# Patient Record
Sex: Female | Born: 1955
Health system: Southern US, Community
[De-identification: ages and names within clinical notes are randomized; demographics above are authoritative.]

## PROBLEM LIST (undated history)

## (undated) DIAGNOSIS — F32A Depression, unspecified: Secondary | ICD-10-CM

## (undated) DIAGNOSIS — F3181 Bipolar II disorder: Secondary | ICD-10-CM

## (undated) DIAGNOSIS — N200 Calculus of kidney: Secondary | ICD-10-CM

## (undated) DIAGNOSIS — K259 Gastric ulcer, unspecified as acute or chronic, without hemorrhage or perforation: Secondary | ICD-10-CM

## (undated) DIAGNOSIS — F988 Other specified behavioral and emotional disorders with onset usually occurring in childhood and adolescence: Secondary | ICD-10-CM

## (undated) DIAGNOSIS — F329 Major depressive disorder, single episode, unspecified: Secondary | ICD-10-CM

## (undated) HISTORY — DX: Bipolar II disorder: F31.81

## (undated) HISTORY — DX: Gastric ulcer, unspecified as acute or chronic, without hemorrhage or perforation: K25.9

## (undated) HISTORY — DX: Other specified behavioral and emotional disorders with onset usually occurring in childhood and adolescence: F98.8

---

## 1997-09-02 ENCOUNTER — Ambulatory Visit (HOSPITAL_COMMUNITY): Admission: RE | Admit: 1997-09-02 | Discharge: 1997-09-02 | Payer: Self-pay | Admitting: Specialist

## 2008-05-23 ENCOUNTER — Emergency Department (HOSPITAL_COMMUNITY): Admission: EM | Admit: 2008-05-23 | Discharge: 2008-05-23 | Payer: Self-pay | Admitting: Emergency Medicine

## 2010-01-10 ENCOUNTER — Emergency Department (HOSPITAL_BASED_OUTPATIENT_CLINIC_OR_DEPARTMENT_OTHER): Admission: EM | Admit: 2010-01-10 | Discharge: 2010-01-11 | Payer: Self-pay | Admitting: Emergency Medicine

## 2010-01-10 ENCOUNTER — Ambulatory Visit: Payer: Self-pay | Admitting: Diagnostic Radiology

## 2015-04-22 DIAGNOSIS — N951 Menopausal and female climacteric states: Secondary | ICD-10-CM | POA: Diagnosis not present

## 2015-04-22 DIAGNOSIS — Z01419 Encounter for gynecological examination (general) (routine) without abnormal findings: Secondary | ICD-10-CM | POA: Diagnosis not present

## 2015-04-22 DIAGNOSIS — Z8709 Personal history of other diseases of the respiratory system: Secondary | ICD-10-CM | POA: Diagnosis not present

## 2015-05-01 DIAGNOSIS — H04123 Dry eye syndrome of bilateral lacrimal glands: Secondary | ICD-10-CM | POA: Diagnosis not present

## 2015-05-21 DIAGNOSIS — K219 Gastro-esophageal reflux disease without esophagitis: Secondary | ICD-10-CM | POA: Diagnosis not present

## 2015-05-21 DIAGNOSIS — K589 Irritable bowel syndrome without diarrhea: Secondary | ICD-10-CM | POA: Diagnosis not present

## 2015-05-21 DIAGNOSIS — R05 Cough: Secondary | ICD-10-CM | POA: Diagnosis not present

## 2015-06-24 DIAGNOSIS — R05 Cough: Secondary | ICD-10-CM | POA: Diagnosis not present

## 2015-06-24 DIAGNOSIS — K449 Diaphragmatic hernia without obstruction or gangrene: Secondary | ICD-10-CM | POA: Diagnosis not present

## 2015-06-24 DIAGNOSIS — K3189 Other diseases of stomach and duodenum: Secondary | ICD-10-CM | POA: Diagnosis not present

## 2015-06-24 DIAGNOSIS — K219 Gastro-esophageal reflux disease without esophagitis: Secondary | ICD-10-CM | POA: Diagnosis not present

## 2015-08-26 DIAGNOSIS — R05 Cough: Secondary | ICD-10-CM | POA: Diagnosis not present

## 2015-08-26 DIAGNOSIS — R1110 Vomiting, unspecified: Secondary | ICD-10-CM | POA: Diagnosis not present

## 2015-08-26 DIAGNOSIS — K219 Gastro-esophageal reflux disease without esophagitis: Secondary | ICD-10-CM | POA: Diagnosis not present

## 2015-09-16 DIAGNOSIS — Z136 Encounter for screening for cardiovascular disorders: Secondary | ICD-10-CM | POA: Diagnosis not present

## 2015-09-16 DIAGNOSIS — K279 Peptic ulcer, site unspecified, unspecified as acute or chronic, without hemorrhage or perforation: Secondary | ICD-10-CM | POA: Diagnosis not present

## 2015-09-16 DIAGNOSIS — Z131 Encounter for screening for diabetes mellitus: Secondary | ICD-10-CM | POA: Diagnosis not present

## 2015-09-16 DIAGNOSIS — Z0000 Encounter for general adult medical examination without abnormal findings: Secondary | ICD-10-CM | POA: Diagnosis not present

## 2015-09-16 DIAGNOSIS — Z13220 Encounter for screening for lipoid disorders: Secondary | ICD-10-CM | POA: Diagnosis not present

## 2015-09-16 DIAGNOSIS — E039 Hypothyroidism, unspecified: Secondary | ICD-10-CM | POA: Diagnosis not present

## 2015-10-15 DIAGNOSIS — K045 Chronic apical periodontitis: Secondary | ICD-10-CM | POA: Diagnosis not present

## 2015-11-11 DIAGNOSIS — Z1231 Encounter for screening mammogram for malignant neoplasm of breast: Secondary | ICD-10-CM | POA: Diagnosis not present

## 2015-11-18 DIAGNOSIS — J310 Chronic rhinitis: Secondary | ICD-10-CM | POA: Diagnosis not present

## 2015-11-18 DIAGNOSIS — K219 Gastro-esophageal reflux disease without esophagitis: Secondary | ICD-10-CM | POA: Diagnosis not present

## 2015-11-18 DIAGNOSIS — R05 Cough: Secondary | ICD-10-CM | POA: Diagnosis not present

## 2015-11-25 DIAGNOSIS — K219 Gastro-esophageal reflux disease without esophagitis: Secondary | ICD-10-CM | POA: Diagnosis not present

## 2015-11-25 DIAGNOSIS — J3089 Other allergic rhinitis: Secondary | ICD-10-CM | POA: Diagnosis not present

## 2015-11-25 DIAGNOSIS — Z23 Encounter for immunization: Secondary | ICD-10-CM | POA: Diagnosis not present

## 2015-11-25 DIAGNOSIS — R05 Cough: Secondary | ICD-10-CM | POA: Diagnosis not present

## 2015-12-30 DIAGNOSIS — K219 Gastro-esophageal reflux disease without esophagitis: Secondary | ICD-10-CM | POA: Diagnosis not present

## 2015-12-30 DIAGNOSIS — R05 Cough: Secondary | ICD-10-CM | POA: Diagnosis not present

## 2015-12-30 DIAGNOSIS — J309 Allergic rhinitis, unspecified: Secondary | ICD-10-CM | POA: Diagnosis not present

## 2016-01-12 DIAGNOSIS — L820 Inflamed seborrheic keratosis: Secondary | ICD-10-CM | POA: Diagnosis not present

## 2016-01-12 DIAGNOSIS — L810 Postinflammatory hyperpigmentation: Secondary | ICD-10-CM | POA: Diagnosis not present

## 2016-01-12 DIAGNOSIS — L538 Other specified erythematous conditions: Secondary | ICD-10-CM | POA: Diagnosis not present

## 2016-01-12 DIAGNOSIS — L738 Other specified follicular disorders: Secondary | ICD-10-CM | POA: Diagnosis not present

## 2016-01-12 DIAGNOSIS — L821 Other seborrheic keratosis: Secondary | ICD-10-CM | POA: Diagnosis not present

## 2016-01-12 DIAGNOSIS — Z09 Encounter for follow-up examination after completed treatment for conditions other than malignant neoplasm: Secondary | ICD-10-CM | POA: Diagnosis not present

## 2016-01-12 DIAGNOSIS — S0080XA Unspecified superficial injury of other part of head, initial encounter: Secondary | ICD-10-CM | POA: Diagnosis not present

## 2016-01-12 DIAGNOSIS — D1801 Hemangioma of skin and subcutaneous tissue: Secondary | ICD-10-CM | POA: Diagnosis not present

## 2016-01-12 DIAGNOSIS — Z872 Personal history of diseases of the skin and subcutaneous tissue: Secondary | ICD-10-CM | POA: Diagnosis not present

## 2016-02-27 DIAGNOSIS — J069 Acute upper respiratory infection, unspecified: Secondary | ICD-10-CM | POA: Diagnosis not present

## 2016-02-27 DIAGNOSIS — R05 Cough: Secondary | ICD-10-CM | POA: Diagnosis not present

## 2016-04-20 DIAGNOSIS — H04123 Dry eye syndrome of bilateral lacrimal glands: Secondary | ICD-10-CM | POA: Diagnosis not present

## 2016-06-05 ENCOUNTER — Emergency Department (HOSPITAL_BASED_OUTPATIENT_CLINIC_OR_DEPARTMENT_OTHER)
Admission: EM | Admit: 2016-06-05 | Discharge: 2016-06-05 | Disposition: A | Discharge status: Home / Self Care | Payer: Medicare Other | Attending: Emergency Medicine | Admitting: Emergency Medicine

## 2016-06-05 ENCOUNTER — Emergency Department (HOSPITAL_BASED_OUTPATIENT_CLINIC_OR_DEPARTMENT_OTHER): Payer: Medicare Other

## 2016-06-05 ENCOUNTER — Encounter (HOSPITAL_BASED_OUTPATIENT_CLINIC_OR_DEPARTMENT_OTHER): Payer: Self-pay | Admitting: Emergency Medicine

## 2016-06-05 DIAGNOSIS — R110 Nausea: Secondary | ICD-10-CM

## 2016-06-05 DIAGNOSIS — R109 Unspecified abdominal pain: Secondary | ICD-10-CM

## 2016-06-05 DIAGNOSIS — Z79899 Other long term (current) drug therapy: Secondary | ICD-10-CM | POA: Insufficient documentation

## 2016-06-05 DIAGNOSIS — N200 Calculus of kidney: Secondary | ICD-10-CM | POA: Diagnosis not present

## 2016-06-05 DIAGNOSIS — N132 Hydronephrosis with renal and ureteral calculous obstruction: Secondary | ICD-10-CM | POA: Diagnosis not present

## 2016-06-05 DIAGNOSIS — R3129 Other microscopic hematuria: Secondary | ICD-10-CM

## 2016-06-05 DIAGNOSIS — R1031 Right lower quadrant pain: Secondary | ICD-10-CM | POA: Diagnosis not present

## 2016-06-05 HISTORY — DX: Calculus of kidney: N20.0

## 2016-06-05 HISTORY — DX: Depression, unspecified: F32.A

## 2016-06-05 HISTORY — DX: Major depressive disorder, single episode, unspecified: F32.9

## 2016-06-05 LAB — URINALYSIS, ROUTINE W REFLEX MICROSCOPIC
BILIRUBIN URINE: NEGATIVE
GLUCOSE, UA: NEGATIVE mg/dL
KETONES UR: NEGATIVE mg/dL
Leukocytes, UA: NEGATIVE
Nitrite: NEGATIVE
PH: 5.5 (ref 5.0–8.0)
Protein, ur: 30 mg/dL — AB
Specific Gravity, Urine: 1.025 (ref 1.005–1.030)

## 2016-06-05 LAB — CBC WITH DIFFERENTIAL/PLATELET
BASOS PCT: 0 %
Basophils Absolute: 0 10*3/uL (ref 0.0–0.1)
EOS ABS: 0.2 10*3/uL (ref 0.0–0.7)
EOS PCT: 2 %
HEMATOCRIT: 41.5 % (ref 36.0–46.0)
Hemoglobin: 14 g/dL (ref 12.0–15.0)
Lymphocytes Relative: 10 %
Lymphs Abs: 0.9 10*3/uL (ref 0.7–4.0)
MCH: 33.5 pg (ref 26.0–34.0)
MCHC: 33.7 g/dL (ref 30.0–36.0)
MCV: 99.3 fL (ref 78.0–100.0)
MONO ABS: 0.8 10*3/uL (ref 0.1–1.0)
MONOS PCT: 8 %
NEUTROS ABS: 7.8 10*3/uL — AB (ref 1.7–7.7)
Neutrophils Relative %: 80 %
PLATELETS: 230 10*3/uL (ref 150–400)
RBC: 4.18 MIL/uL (ref 3.87–5.11)
RDW: 12.1 % (ref 11.5–15.5)
WBC: 9.7 10*3/uL (ref 4.0–10.5)

## 2016-06-05 LAB — COMPREHENSIVE METABOLIC PANEL
ALBUMIN: 4.4 g/dL (ref 3.5–5.0)
ALT: 19 U/L (ref 14–54)
ANION GAP: 11 (ref 5–15)
AST: 24 U/L (ref 15–41)
Alkaline Phosphatase: 70 U/L (ref 38–126)
BILIRUBIN TOTAL: 0.6 mg/dL (ref 0.3–1.2)
BUN: 37 mg/dL — ABNORMAL HIGH (ref 6–20)
CO2: 26 mmol/L (ref 22–32)
Calcium: 9.3 mg/dL (ref 8.9–10.3)
Chloride: 104 mmol/L (ref 101–111)
Creatinine, Ser: 1 mg/dL (ref 0.44–1.00)
GFR calc Af Amer: >60 mL/min (ref >60)
GLUCOSE: 103 mg/dL — AB (ref 65–99)
POTASSIUM: 3.8 mmol/L (ref 3.5–5.1)
Sodium: 141 mmol/L (ref 135–145)
TOTAL PROTEIN: 6.9 g/dL (ref 6.5–8.1)

## 2016-06-05 LAB — URINALYSIS, MICROSCOPIC (REFLEX)

## 2016-06-05 MED ORDER — MORPHINE SULFATE (PF) 4 MG/ML IV SOLN
4.0000 mg | Freq: Once | INTRAVENOUS | Status: AC
Start: 2016-06-05 — End: 2016-06-05
  Administered 2016-06-05: 4 mg via INTRAVENOUS
  Filled 2016-06-05: qty 1

## 2016-06-05 MED ORDER — TAMSULOSIN HCL 0.4 MG PO CAPS: 0.4000 mg | ORAL_CAPSULE | Freq: Every day | ORAL | 0 refills | Status: DC

## 2016-06-05 MED ORDER — NAPROXEN 500 MG PO TABS: 500.0000 mg | ORAL_TABLET | Freq: Two times a day (BID) | ORAL | 0 refills | Status: DC | PRN

## 2016-06-05 MED ORDER — SODIUM CHLORIDE 0.9 % IV BOLUS (SEPSIS)
1000.0000 mL | Freq: Once | INTRAVENOUS | Status: AC
  Administered 2016-06-05: 1000 mL via INTRAVENOUS

## 2016-06-05 MED ORDER — KETOROLAC TROMETHAMINE 30 MG/ML IJ SOLN
30.0000 mg | Freq: Once | INTRAMUSCULAR | Status: AC
  Administered 2016-06-05: 30 mg via INTRAVENOUS
  Filled 2016-06-05: qty 1

## 2016-06-05 MED ORDER — ONDANSETRON 8 MG PO TBDP: 8.0000 mg | ORAL_TABLET | Freq: Three times a day (TID) | ORAL | 0 refills | Status: DC | PRN

## 2016-06-05 MED ORDER — ONDANSETRON HCL 4 MG/2ML IJ SOLN
4.0000 mg | Freq: Once | INTRAMUSCULAR | Status: AC
  Administered 2016-06-05: 4 mg via INTRAVENOUS
  Filled 2016-06-05: qty 2

## 2016-06-05 MED ORDER — OXYCODONE-ACETAMINOPHEN 5-325 MG PO TABS: 1.0000 | ORAL_TABLET | Freq: Four times a day (QID) | ORAL | 0 refills | Status: DC | PRN

## 2016-06-05 NOTE — ED Notes (Signed)
Patient transported to CT 

## 2016-06-05 NOTE — Discharge Instructions (Signed)
Take naprosyn as directed as needed for pain using your home oxycodone for breakthrough pain. Do not drive or operate machinery with pain medication use. May need over-the-counter stool softener with this pain medication use. Use Zofran as needed for nausea. Use Flomax as directed, as this medication will help you pass the stone. Strain all urine to try to catch the stone when it passes. Follow-up with the urologist in the next 3-5 days for recheck of ongoing symptoms and ongoing management of your kidney stones, however for intractable or uncontrollable pain at home then return to the [facility] emergency department.

## 2016-06-05 NOTE — ED Provider Notes (Signed)
[facility]   CSN: [id number] Arrival date & time: 06/05/16  1220     History   Chief Complaint Chief Complaint  Patient presents with  . Flank Pain    HPI Megan Weaver is a 61 y.o. female with a PMHx of depression and nephrolithiasis, who presents to the ED with complaints of sudden onset right flank and right lower quadrant pain that began around 11 AM, and states that she "thinks she has a kidney stone". Patient reports several week history of mild vague abdominal discomfort, but this morning around 11 AM she developed sudden worsening of her pain. Describes the pain is 9/10 constant sharp right flank pain radiating to the right lower quadrant, worse with movement and activity, and improved with home oxycodone. She states this is very similar to her prior kidney stones, which in the past started with several weeks of vague abdominal discomfort before drastically worsening. Associated symptoms today includes nausea and increased urinary frequency/urgency and hesitancy. Her urologist is Dr. Mariel Craft. Hx of requiring cystoscopy with ureteral stent and lithotripsy for her prior stones. She is postmenopausal, no longer has menses, denies chance of pregnancy. Admits to social EtOH use, drank last night (2 vodka tonics and a couple glasses of wine).   She denies fevers, chills, CP, SOB, vomiting, diarrhea/constipation, obstipation, melena, hematochezia, hematuria, dysuria, malodorous urine, vaginal bleeding/discharge, myalgias, arthralgias, numbness, tingling, focal weakness, or any other complaints at this time. Denies recent travel, sick contacts, suspicious food intake, NSAID use, or other prior abd surgeries.    The history is provided by the patient and medical records. No language interpreter was used.  Flank Pain  This is a new problem. The current episode started 1 to 2 hours ago. The problem occurs constantly. The problem has not changed since  onset.Associated symptoms include abdominal pain. Pertinent negatives include no chest pain and no shortness of breath. The symptoms are aggravated by walking. The symptoms are relieved by narcotics. Treatments tried: oxycodone. The treatment provided moderate relief.    Past Medical History:  Diagnosis Date  . Depression   . Kidney stones     There are no active problems to display for this patient.   History reviewed. No pertinent surgical history.  OB History    No data available       Home Medications    Prior to Admission medications   Medication Sig Start Date End Date Taking? Authorizing Provider  acyclovir (ZOVIRAX) 400 MG tablet Take 400 mg by mouth 5 (five) times daily.   Yes Historical Provider, MD  estradiol-norethindrone (ACTIVELLA) 1-0.5 MG tablet Take 1 tablet by mouth daily.   Yes Historical Provider, MD  montelukast (SINGULAIR) 10 MG tablet Take 10 mg by mouth at bedtime.   Yes Historical Provider, MD  pantoprazole (PROTONIX) 40 MG tablet Take 40 mg by mouth daily.   Yes Historical Provider, MD  risperiDONE (RISPERDAL) 1 MG tablet Take 1 mg by mouth at bedtime.   Yes Historical Provider, MD  traZODone (DESYREL) 50 MG tablet Take 50 mg by mouth at bedtime.   Yes Historical Provider, MD  venlafaxine (EFFEXOR) 100 MG tablet Take 100 mg by mouth 2 (two) times daily.   Yes Historical Provider, MD    Family History No family history on file.  Social History Social History  Substance Use Topics  . Smoking status: Never Smoker  . Smokeless tobacco: Never Used  . Alcohol use Yes     Comment: weekly  Allergies   Patient has no known allergies.   Review of Systems Review of Systems  Constitutional: Negative for chills and fever.  Respiratory: Negative for shortness of breath.   Cardiovascular: Negative for chest pain.  Gastrointestinal: Positive for abdominal pain and nausea. Negative for blood in stool, constipation, diarrhea and vomiting.    Genitourinary: Positive for flank pain, frequency and urgency. Negative for dysuria, hematuria, vaginal bleeding and vaginal discharge.  Musculoskeletal: Negative for arthralgias and myalgias.  Skin: Negative for color change.  Allergic/Immunologic: Negative for immunocompromised state.  Neurological: Negative for weakness and numbness.  Psychiatric/Behavioral: Negative for confusion.   10 Systems reviewed and are negative for acute change except as noted in the HPI.   Physical Exam Updated Vital Signs BP 131/70 (BP Location: Left Arm)   Pulse 86   Temp 98.3 F (36.8 C) (Oral)   Resp 20   Ht  (1.6 m)   Wt 56.7 kg   SpO2 97%   BMI 22.14 kg/m   Physical Exam  Constitutional: She is oriented to person, place, and time. Vital signs are normal. She appears well-developed and well-nourished.  Non-toxic appearance. No distress.  Afebrile, nontoxic, NAD  HENT:  Head: Normocephalic and atraumatic.  Mouth/Throat: Oropharynx is clear and moist and mucous membranes are normal.  Eyes: Conjunctivae and EOM are normal. Right eye exhibits no discharge. Left eye exhibits no discharge.  Neck: Normal range of motion. Neck supple.  Cardiovascular: Normal rate, regular rhythm, normal heart sounds and intact distal pulses.  Exam reveals no gallop and no friction rub.   No murmur heard. Pulmonary/Chest: Effort normal and breath sounds normal. No respiratory distress. She has no decreased breath sounds. She has no wheezes. She has no rhonchi. She has no rales.  Abdominal: Soft. Normal appearance and bowel sounds are normal. She exhibits no distension. There is tenderness in the right lower quadrant. There is no rigidity, no rebound, no guarding, no CVA tenderness, no tenderness at McBurney's point and negative Murphy's sign.    Soft, nondistended, +BS throughout, with mild RLQ TTP tracking towards the R flank region, no r/g/r, neg murphy's, neg mcburney's point tenderness, no CVA TTP    Musculoskeletal: Normal range of motion.  Neurological: She is alert and oriented to person, place, and time. She has normal strength. No sensory deficit.  Skin: Skin is warm, dry and intact. No rash noted.  Psychiatric: She has a normal mood and affect.  Nursing note and vitals reviewed.    ED Treatments / Results  Labs (all labs ordered are listed, but only abnormal results are displayed) Labs Reviewed  URINALYSIS, ROUTINE W REFLEX MICROSCOPIC - Abnormal; Notable for the following:       Result Value   APPearance CLOUDY (*)    Hgb urine dipstick LARGE (*)    Protein, ur 30 (*)    All other components within normal limits  CBC WITH DIFFERENTIAL/PLATELET - Abnormal; Notable for the following:    Neutro Abs 7.8 (*)    All other components within normal limits  COMPREHENSIVE METABOLIC PANEL - Abnormal; Notable for the following:    Glucose, Bld 103 (*)    BUN 37 (*)    All other components within normal limits  URINALYSIS, MICROSCOPIC (REFLEX) - Abnormal; Notable for the following:    Bacteria, UA RARE (*)    Squamous Epithelial / LPF 0-5 (*)    All other components within normal limits  URINE CULTURE    EKG  EKG Interpretation  None       Radiology Ct Renal Stone Study  Result Date: 06/05/2016 CLINICAL DATA:  Right flank pain.  Hematuria. EXAM: CT ABDOMEN AND PELVIS WITHOUT CONTRAST TECHNIQUE: Multidetector CT imaging of the abdomen and pelvis was performed following the standard protocol without IV contrast. COMPARISON:  Report of CT abdomen and pelvis from [facility] 05/01/2012 FINDINGS: Lower chest: The in the lung bases demonstrate mild dependent atelectasis bilaterally. Bilateral breast implants are noted. Heart size normal. No significant pleural or pericardial effusion is present. Hepatobiliary: No focal liver abnormality is seen. No gallstones, gallbladder wall thickening, or biliary dilatation. Pancreas: Unremarkable. No pancreatic ductal dilatation or  surrounding inflammatory changes. Spleen: Normal in size without focal abnormality. Adrenals/Urinary Tract: Adrenal glands are normal bilaterally. Mild hydronephrosis is present bilaterally, right greater than left. Obstructing 6 mm stone is present at the left UPJ. More distal left ureter is unremarkable. An obstructing 4 mm stone is present at the distal right ureter, just above the UVJ. The urinary bladder is mostly collapsed. Stomach/Bowel: The stomach and duodenum are within normal limits. Small bowel is unremarkable. Appendix is visualized and normal. The ascending and transverse colon are within normal limits. The descending and sigmoid colon are within normal limits. Vascular/Lymphatic: No significant vascular findings are present. No enlarged abdominal or pelvic lymph nodes. Reproductive: Within normal limits for age. Other: No significant free fluid or free air is present. Musculoskeletal: Mild degenerative changes are noted in the lumbar spine. No focal lytic or blastic lesions present. The bony pelvis is intact. The hips are unremarkable. IMPRESSION: 1. Bilateral mild hydronephrosis, right greater than left trauma with bilateral obstructing stones. 2. 6 mm obstructing stone at the left UPJ. 3. Obstructing 4 mm stone at the right UVJ with dilation of the entire right ureter. 4. No additional nephrolithiasis an either kidney. Electronically Signed   By: Marin Roberts M.D.   On: 06/05/2016 13:47    Procedures Procedures (including critical care time)  Medications Ordered in ED Medications  sodium chloride 0.9 % bolus 1,000 mL (1,000 mLs Intravenous New Bag/Given 06/05/16 1321)  ketorolac (TORADOL) 30 MG/ML injection 30 mg (30 mg Intravenous Given 06/05/16 1322)  ondansetron (ZOFRAN) injection 4 mg (4 mg Intravenous Given 06/05/16 1321)  morphine 4 MG/ML injection 4 mg (4 mg Intravenous Given 06/05/16 1322)     Initial Impression / Assessment and Plan / ED Course  I have reviewed the  triage vital signs and the nursing notes.  Pertinent labs & imaging results that were available during my care of the patient were reviewed by me and considered in my medical decision making (see chart for details).     61 y.o. female here with R flank pain radiating to RLQ with urinary frequency and nausea today, states it feels like prior kidney stones. On exam, mild RLQ TTP tracking towards the R flank, nonperitoneal, no mcburney's point tenderness, no CVA TTP. U/A pending, will add CBC w/diff, CMP, and get CT renal study. Will give fluids, pain meds, and nausea meds. Will reassess shortly  3:27 PM Pt feeling much better. CBC w/diff WNL. CMP with mildly elevated BUN 37 but Cr 1.0, otherwise CMP WNL. U/A with large hgb, nitrite and leuk neg, 0-5 squamous, CaOx crystals present, 0-5 WBC, TNTC RBCs, and rare bacteria; doesn't seem like UTI but will send for UCx. CT reveals b/l mild hydronephrosis R>L with b/l obstructing stones (6mm left UPJ, 4mm right UVJ with dilation of entire R ureter), appendix normal. The R  stone is a passable size, and the L stone is upper limits but should still be able to pass. Given normal kidney function and pain/nausea is not intractable, I feel it's reasonable to send her home with close urology f/up. Rx zofran, naprosyn, and flomax given, urine strainer given. Advised use of home pain meds, short term refill provided. F/up with urology in 3-5 days for ongoing management of her kidney stones. I explained the diagnosis and have given explicit precautions to return to the ER including for any other new or worsening symptoms. The patient understands and accepts the medical plan as it's been dictated and I have answered their questions. Discharge instructions concerning home care and prescriptions have been given. The patient is STABLE and is discharged to home in good condition.    Final Clinical Impressions(s) / ED Diagnoses   Final diagnoses:  Right flank pain  Colicky  RLQ abdominal pain  Nausea  Other microscopic hematuria  Nephrolithiasis    New Prescriptions New Prescriptions   NAPROXEN (NAPROSYN) 500 MG TABLET    Take 1 tablet (500 mg total) by mouth 2 (two) times daily as needed for mild pain, moderate pain or headache (TAKE WITH MEALS.).   ONDANSETRON (ZOFRAN ODT) 8 MG DISINTEGRATING TABLET    Take 1 tablet (8 mg total) by mouth every 8 (eight) hours as needed for nausea or vomiting.   OXYCODONE-ACETAMINOPHEN (PERCOCET) 5-325 MG TABLET    Take 1 tablet by mouth every 6 (six) hours as needed for severe pain.   TAMSULOSIN (FLOMAX) 0.4 MG CAPS CAPSULE    Take 1 capsule (0.4 mg total) by mouth daily after supper. Take until the stones pass     [address], PA-C 06/05/16 1527    Melene Plan, DO 06/06/16 2013

## 2016-06-05 NOTE — ED Triage Notes (Addendum)
R flank pain with urinary frequency since this morning. Pt took oxycodone PTA which relieved pain

## 2016-06-06 DIAGNOSIS — N200 Calculus of kidney: Secondary | ICD-10-CM | POA: Diagnosis not present

## 2016-06-07 DIAGNOSIS — N201 Calculus of ureter: Secondary | ICD-10-CM | POA: Diagnosis not present

## 2016-06-07 DIAGNOSIS — N1339 Other hydronephrosis: Secondary | ICD-10-CM | POA: Diagnosis not present

## 2016-06-07 DIAGNOSIS — N132 Hydronephrosis with renal and ureteral calculous obstruction: Secondary | ICD-10-CM | POA: Diagnosis not present

## 2016-06-07 LAB — URINE CULTURE

## 2016-06-08 DIAGNOSIS — N200 Calculus of kidney: Secondary | ICD-10-CM | POA: Diagnosis not present

## 2016-06-14 DIAGNOSIS — N201 Calculus of ureter: Secondary | ICD-10-CM | POA: Diagnosis not present

## 2016-06-16 DIAGNOSIS — N200 Calculus of kidney: Secondary | ICD-10-CM | POA: Diagnosis not present

## 2016-06-16 DIAGNOSIS — N1330 Unspecified hydronephrosis: Secondary | ICD-10-CM | POA: Diagnosis not present

## 2016-07-31 DIAGNOSIS — N200 Calculus of kidney: Secondary | ICD-10-CM | POA: Diagnosis not present

## 2016-08-01 DIAGNOSIS — N200 Calculus of kidney: Secondary | ICD-10-CM | POA: Diagnosis not present

## 2016-09-13 DIAGNOSIS — N200 Calculus of kidney: Secondary | ICD-10-CM | POA: Diagnosis not present

## 2016-09-23 DIAGNOSIS — Z0000 Encounter for general adult medical examination without abnormal findings: Secondary | ICD-10-CM | POA: Diagnosis not present

## 2016-09-23 DIAGNOSIS — K279 Peptic ulcer, site unspecified, unspecified as acute or chronic, without hemorrhage or perforation: Secondary | ICD-10-CM | POA: Diagnosis not present

## 2016-09-23 DIAGNOSIS — Z1159 Encounter for screening for other viral diseases: Secondary | ICD-10-CM | POA: Diagnosis not present

## 2016-09-23 DIAGNOSIS — E039 Hypothyroidism, unspecified: Secondary | ICD-10-CM | POA: Diagnosis not present

## 2016-09-23 DIAGNOSIS — Z131 Encounter for screening for diabetes mellitus: Secondary | ICD-10-CM | POA: Diagnosis not present

## 2016-12-28 DIAGNOSIS — Z1231 Encounter for screening mammogram for malignant neoplasm of breast: Secondary | ICD-10-CM | POA: Diagnosis not present

## 2017-01-11 DIAGNOSIS — Z872 Personal history of diseases of the skin and subcutaneous tissue: Secondary | ICD-10-CM | POA: Diagnosis not present

## 2017-01-11 DIAGNOSIS — D239 Other benign neoplasm of skin, unspecified: Secondary | ICD-10-CM | POA: Diagnosis not present

## 2017-01-11 DIAGNOSIS — L821 Other seborrheic keratosis: Secondary | ICD-10-CM | POA: Diagnosis not present

## 2017-01-11 DIAGNOSIS — Z09 Encounter for follow-up examination after completed treatment for conditions other than malignant neoplasm: Secondary | ICD-10-CM | POA: Diagnosis not present

## 2017-04-21 DIAGNOSIS — H04123 Dry eye syndrome of bilateral lacrimal glands: Secondary | ICD-10-CM | POA: Diagnosis not present

## 2017-06-20 ENCOUNTER — Other Ambulatory Visit: Payer: Self-pay

## 2017-06-20 ENCOUNTER — Emergency Department
Admission: EM | Admit: 2017-06-20 | Discharge: 2017-06-20 | Disposition: A | Discharge status: Home / Self Care | Payer: Medicare Other | Source: Home / Self Care

## 2017-06-20 ENCOUNTER — Encounter: Payer: Self-pay | Admitting: *Deleted

## 2017-06-20 ENCOUNTER — Emergency Department (INDEPENDENT_AMBULATORY_CARE_PROVIDER_SITE_OTHER): Payer: Medicare Other

## 2017-06-20 DIAGNOSIS — S99911A Unspecified injury of right ankle, initial encounter: Secondary | ICD-10-CM

## 2017-06-20 DIAGNOSIS — M25572 Pain in left ankle and joints of left foot: Secondary | ICD-10-CM

## 2017-06-20 DIAGNOSIS — X58XXXA Exposure to other specified factors, initial encounter: Secondary | ICD-10-CM | POA: Diagnosis not present

## 2017-06-20 DIAGNOSIS — S82392A Other fracture of lower end of left tibia, initial encounter for closed fracture: Secondary | ICD-10-CM | POA: Diagnosis not present

## 2017-06-20 HISTORY — DX: Calculus of kidney: N20.0

## 2017-06-20 NOTE — ED Triage Notes (Signed)
Patient reports rolling left ankle on a step and falling 3 days ago. Site is edematous and bruised. Previous strain to the left ankle. Used ice and elevation at home.

## 2017-06-20 NOTE — ED Provider Notes (Signed)
[facility]    CSN: [id number] Arrival date & time: 06/20/17  1445     History   Chief Complaint Chief Complaint  Patient presents with  . Ankle Pain    HPI Megan Weaver is a 62 y.o. female.   The history is provided by the patient. No language interpreter was used.  Ankle Pain  Location:  Ankle Time since incident:  3 days Injury: yes   Ankle location:  L ankle Pain details:    Quality:  Aching   Radiates to:  Does not radiate   Severity:  Mild   Onset quality:  Gradual   Duration:  3 days   Timing:  Constant   Progression:  Worsening Chronicity:  New Dislocation: no   Relieved by:  Nothing Worsened by:  Nothing Ineffective treatments:  None tried Pt complains of pain in her left ankle after turning ankle.  Pt reports bruising and swelling.  She is able to walk without difficulty  Past Medical History:  Diagnosis Date  . Depression   . Kidney stone   . Kidney stones     There are no active problems to display for this patient.   History reviewed. No pertinent surgical history.  OB History   None      Home Medications    Prior to Admission medications   Medication Sig Start Date End Date Taking? Authorizing Provider  acyclovir (ZOVIRAX) 400 MG tablet Take 400 mg by mouth 5 (five) times daily.   Yes [provider]  ARIPiprazole (ABILIFY) 5 MG tablet Take 5 mg by mouth daily.   Yes [provider]  Carboxymethylcellulose Sodium (EYE DROPS OP) Apply to eye.   Yes [provider]  Cholecalciferol (VITAMIN D3) 2000 units TABS Take by mouth.   Yes [provider]  docusate sodium (COLACE) 100 MG capsule Take 100 mg by mouth 2 (two) times daily.   Yes [provider]  estradiol-norethindrone (ACTIVELLA) 1-0.5 MG tablet Take 1 tablet by mouth daily.   Yes [provider]  L-Methylfolate-Algae (DEPLIN 15 PO) Take by mouth.   Yes [provider]  levothyroxine (SYNTHROID,  LEVOTHROID) 75 MCG tablet  04/03/17  Yes [provider]  montelukast (SINGULAIR) 10 MG tablet Take 10 mg by mouth at bedtime.   Yes [provider]  NON FORMULARY    Yes [provider]  pantoprazole (PROTONIX) 40 MG tablet Take 40 mg by mouth daily.   Yes [provider]  Probiotic Product (PROBIOTIC PO) Take by mouth.   Yes [provider]  ranitidine (ZANTAC) 150 MG tablet Take 150 mg by mouth 2 (two) times daily.   Yes [provider]  traZODone (DESYREL) 50 MG tablet Take 50 mg by mouth at bedtime.   Yes [provider]  venlafaxine (EFFEXOR) 100 MG tablet Take 150 mg by mouth 2 (two) times daily.    Yes [provider]    Family History Family History  Problem Relation Age of Onset  . Hypertension Mother   . Hypertension Father   . Heart attack Father     Social History Social History   Tobacco Use  . Smoking status: Never Smoker  . Smokeless tobacco: Never Used  Substance Use Topics  . Alcohol use: Yes    Comment: weekly  . Drug use: Not on file     Allergies   Patient has no known allergies.   Review of Systems Review of Systems  All other  systems reviewed and are negative.    Physical Exam Triage Vital Signs ED Triage Vitals  Enc Vitals Group     BP 06/20/17 1459 126/76     Pulse Rate 06/20/17 1459 83     Resp --      Temp --      Temp src --      SpO2 06/20/17 1459 96 %     Weight 06/20/17 1500 141 lb (64 kg)     Height 06/20/17 1500  (1.6 m)     Head Circumference --      Peak Flow --      Pain Score 06/20/17 1500 4     Pain Loc --      Pain Edu? --      Excl. in GC? --    No data found.  Updated Vital Signs BP 126/76 (BP Location: Right Arm)   Pulse 83   Ht  (1.6 m)   Wt 141 lb (64 kg)   SpO2 96%   BMI 24.98 kg/m   Visual Acuity Right Eye Distance:   Left Eye Distance:   Bilateral Distance:    Right Eye Near:   Left Eye Near:    Bilateral Near:       Physical Exam  Constitutional: She appears well-developed and well-nourished.  Pulmonary/Chest: Effort normal.  Musculoskeletal: She exhibits edema and tenderness.  Erythema , bruised swollen foot and ankle.  Normal range of motion nv and ns intact   Neurological: She is alert.  Skin: Skin is warm.  Psychiatric: She has a normal mood and affect.  Nursing note and vitals reviewed.    UC Treatments / Results  Labs (all labs ordered are listed, but only abnormal results are displayed) Labs Reviewed - No data to display  EKG None  Radiology Dg Ankle Complete Left  Result Date: 06/20/2017 CLINICAL DATA:  Ankle injury. EXAM: LEFT ANKLE COMPLETE - 3+ VIEW COMPARISON:  01/10/2010. FINDINGS: Diffuse soft tissue swelling. Small avulsion fracture noted from the distal tip of the lateral malleolus. This appears to be acute. Small avulsion fracture from the medial aspect of the talus may be present. Tiny bony density noted along the inferior aspect of the medial malleolus may represent an old fracture fragment. Tiny bony density noted along the anterior aspect of the distal tibia may represent a tiny avulsion fracture. IMPRESSION: 1. Diffuse soft tissue swelling. Small avulsion fracture noted from the distal tip of the lateral malleolus. This appears acute. 2. Small bony density noted along the medial aspect of the talus. This may represent and acute avulsion fracture. Small bony density noted adjacent to the lateral malleolus could represent a old fracture fragment as a similar finding was noted on prior exam. 3. Small fracture fragment arising from the anterior aspect of the distal tibia cannot be excluded. Electronically Signed   By: Maisie Fus  Register   On: 06/20/2017 15:38    Procedures Procedures (including critical care time)  Medications Ordered in UC Medications - No data to display  Initial Impression / Assessment and Plan / UC Course  I have reviewed the triage vital signs and the  nursing notes.  Pertinent labs & imaging results that were available during my care of the patient were reviewed by me and considered in my medical decision making (see chart for details).   MDM  Probable small avulsion fracture.  Pt [placed in ASO.   Pt advised to follow up with Sports Medicine doctors for  evaltuion in 1 week.    Final Clinical Impressions(s) / UC Diagnoses   Final diagnoses:  Acute left ankle pain   Discharge Instructions   None    ED Prescriptions    None    An After Visit Summary was printed and given to the patient.  Controlled Substance Prescriptions [place] Controlled Substance Registry consulted? Not Applicable   Elson Areas, [place] 06/20/17 1551

## 2017-06-20 NOTE — Discharge Instructions (Addendum)
Return if any problems.

## 2017-06-30 ENCOUNTER — Ambulatory Visit (INDEPENDENT_AMBULATORY_CARE_PROVIDER_SITE_OTHER): Payer: Medicare Other

## 2017-06-30 ENCOUNTER — Encounter: Payer: Self-pay | Admitting: Family Medicine

## 2017-06-30 ENCOUNTER — Ambulatory Visit (INDEPENDENT_AMBULATORY_CARE_PROVIDER_SITE_OTHER): Payer: Medicare Other | Admitting: Family Medicine

## 2017-06-30 VITALS — BP 118/57 | HR 86 | Ht 63.0 in | Wt 138.0 lb

## 2017-06-30 DIAGNOSIS — S8262XA Displaced fracture of lateral malleolus of left fibula, initial encounter for closed fracture: Secondary | ICD-10-CM

## 2017-06-30 DIAGNOSIS — M25572 Pain in left ankle and joints of left foot: Secondary | ICD-10-CM

## 2017-06-30 DIAGNOSIS — M79672 Pain in left foot: Secondary | ICD-10-CM

## 2017-06-30 DIAGNOSIS — X501XXA Overexertion from prolonged static or awkward postures, initial encounter: Secondary | ICD-10-CM

## 2017-06-30 NOTE — Progress Notes (Signed)
Subjective:    I'm seeing this patient as a consultation for:  Langston Masker PA-C  CC: Left ankle injury.  HPI: Megan Weaver suffered an inversion injury to her left ankle on April 27.  She was seen in urgent care on April 30.  At that time she had bruising and swelling.  X-rays showed possible old avulsion fracture but nothing acute appearing.  She was given an ASO brace which has helped a lot.  She notes with ice elevation compression and bracing her pain has improved.  However she does continue to experience lateral ankle swelling and pain.  Additionally she notes new onset of forefoot pain.  This is present now for a week or so.  She denies any repeat injury.  She has bruising along the forefoot as well.  Past medical history, Surgical history, Family history not pertinant except as noted below, Social history, Allergies, and medications have been entered into the medical record, reviewed, and no changes needed.   Review of Systems: No headache, visual changes, nausea, vomiting, diarrhea, constipation, dizziness, abdominal pain, skin rash, fevers, chills, night sweats, weight loss, swollen lymph nodes, body aches, joint swelling, muscle aches, chest pain, shortness of breath, mood changes, visual or auditory hallucinations.   Objective:    Vitals:   06/30/17 0908  BP: (!) 118/57  Pulse: 86   General: Well Developed, well nourished, and in no acute distress.  Neuro/Psych: Alert and oriented x3, extra-ocular muscles intact, able to move all 4 extremities, sensation grossly intact. Skin: Warm and dry, no rashes noted.  Respiratory: Not using accessory muscles, speaking in full sentences, trachea midline.  Cardiovascular: Pulses palpable, no extremity edema. Abdomen: Does not appear distended. MSK:  Left ankle ecchymosis and swelling present.  Tender to palpation at the ATFL area.  Nontender calcaneus medial malleolus and knee. Left foot: Ecchymosis present at the dorsal distal forefoot along  the second third and fourth metatarsal heads. Tender to palpation along the distal second and third metatarsals Foot is nontender otherwise.  Pulses capillary fill and sensation are intact.  Stability and motion are intact at the ankle and foot.  X-ray left ankle reveals old avulsions at both the medial and lateral radial malleolus.  No acute fracture seen.  Some degenerative changes are present.  X-ray foot reveals no acute fractures visible along the metatarsals. Awaiting formal radiology review.  I personally independent reviewed x-ray images.    Impression and Recommendations:    Assessment and Plan: 62 y.o. female with left foot and ankle pain due to inversion injury and sprain.  The ankle pain is very likely sprain.  Plan to treat with home exercise program and compressive ankle sleeve and brace as needed.  If not improving next step would be physical therapy.  Recheck in 3 weeks.  For foot pain: I suspect patient developed forefoot pain because she has had alter her gait due to her ankle pain.  Plan to treat as above.  If not improving will return to clinic for further work-up and evaluation.  Patient declined formal referral to physical therapy.   Orders Placed This Encounter  Procedures  . DG Ankle Complete Left    Standing Status:   Future    Number of Occurrences:   1    Standing Expiration Date:   08/31/2018    Order Specific Question:   Reason for Exam (SYMPTOM  OR DIAGNOSIS REQUIRED)    Answer:   follow up sprain vs avusion    Order Specific  Question:   Is patient pregnant?    Answer:   No    Order Specific Question:   Preferred imaging location?    Answer:   [facility]    Order Specific Question:   Radiology Contrast Protocol - do NOT remove file path    Answer:   [email or web address]  . DG Foot Complete Left    Standing Status:   Future    Number of Occurrences:   1    Standing Expiration Date:   08/31/2018     Order Specific Question:   Reason for Exam (SYMPTOM  OR DIAGNOSIS REQUIRED)    Answer:   eval pain forefoot    Order Specific Question:   Is patient pregnant?    Answer:   No    Order Specific Question:   Preferred imaging location?    Answer:   [facility]    Order Specific Question:   Radiology Contrast Protocol - do NOT remove file path    Answer:   [email or web address]   No orders of the defined types were placed in this encounter.   Discussed warning signs or symptoms. Please see discharge instructions. Patient expresses understanding.

## 2017-06-30 NOTE — Patient Instructions (Signed)
Thank you for coming in today. Start home exercises.,  Recheck with me in 3 weeks,  Return sooner if needed If you want to start formal PT let me know.  Use body helix full ankle compression sleeve or lace up ankle brace.    Ankle Sprain, Phase I Rehab Ask your health care provider which exercises are safe for you. Do exercises exactly as told by your health care provider and adjust them as directed. It is normal to feel mild stretching, pulling, tightness, or discomfort as you do these exercises, but you should stop right away if you feel sudden pain or your pain gets worse.Do not begin these exercises until told by your health care provider. Stretching and range of motion exercises These exercises warm up your muscles and joints and improve the movement and flexibility of your lower leg and ankle. These exercises also help to relieve pain and stiffness. Exercise A: Gastroc and soleus stretch  1. Sit on the floor with your left / right leg extended. 2. Loop a belt or towel around the ball of your left / right foot. The ball of your foot is on the walking surface, right under your toes. 3. Keep your left / right ankle and foot relaxed and keep your knee straight while you use the belt or towel to pull your foot toward you. You should feel a gentle stretch behind your calf or knee. 4. Hold this position for __________ seconds, then release to the starting position. Repeat the exercise with your knee bent. You can put a pillow or a rolled bath towel under your knee to support it. You should feel a stretch deep in your calf or at your Achilles tendon. Repeat each stretch __________ times. Complete these stretches __________ times a day. Exercise B: Ankle alphabet  1. Sit with your left / right leg supported at the lower leg. ? Do not rest your foot on anything. ? Make sure your foot has room to move freely. 2. Think of your left / right foot as a paintbrush, and move your foot to trace each  letter of the alphabet in the air. Keep your hip and knee still while you trace. Make the letters as large as you can without feeling discomfort. 3. Trace every letter from A to Z. Repeat __________ times. Complete this exercise __________ times a day. Strengthening exercises These exercises build strength and endurance in your ankle and lower leg. Endurance is the ability to use your muscles for a long time, even after they get tired. Exercise C: Dorsiflexors  1. Secure a rubber exercise band or tube to an object, such as a table leg, that will stay still when the band is pulled. Secure the other end around your left / right foot. 2. Sit on the floor facing the object, with your left / right leg extended. The band or tube should be slightly tense when your foot is relaxed. 3. Slowly bring your foot toward you, pulling the band tighter. 4. Hold this position for __________ seconds. 5. Slowly return your foot to the starting position. Repeat __________ times. Complete this exercise __________ times a day. Exercise D: Plantar flexors  1. Sit on the floor with your left / right leg extended. 2. Loop a rubber exercise tube or band around the ball of your left / right foot. The ball of your foot is on the walking surface, right under your toes. ? Hold the ends of the band or tube in your hands. ?  The band or tube should be slightly tense when your foot is relaxed. 3. Slowly point your foot and toes downward, pushing them away from you. 4. Hold this position for __________ seconds. 5. Slowly return your foot to the starting position. Repeat __________ times. Complete this exercise __________ times a day. Exercise E: Evertors 1. Sit on the floor with your legs straight out in front of you. 2. Loop a rubber exercise band or tube around the ball of your left / right foot. The ball of your foot is on the walking surface, right under your toes. ? Hold the ends of the band in your hands, or secure the  band to a stable object. ? The band or tube should be slightly tense when your foot is relaxed. 3. Slowly push your foot outward, away from your other leg. 4. Hold this position for __________ seconds. 5. Slowly return your foot to the starting position. Repeat __________ times. Complete this exercise __________ times a day. This information is not intended to replace advice given to you by your health care provider. Make sure you discuss any questions you have with your health care provider. Document Released: 09/08/2004 Document Revised: 10/15/2015 Document Reviewed: 12/22/2014 Elsevier Interactive Patient Education  2018 [employer].    Ankle Sprain, Phase II Rehab Ask your health care provider which exercises are safe for you. Do exercises exactly as told by your health care provider and adjust them as directed. It is normal to feel mild stretching, pulling, tightness, or discomfort as you do these exercises, but you should stop right away if you feel sudden pain or your pain gets worse.Do not begin these exercises until told by your health care provider. Stretching and range of motion exercises These exercises warm up your muscles and joints and improve the movement and flexibility of your lower leg and ankle. These exercises also help to relieve pain and stiffness. Exercise A: Gastroc stretch, standing  1. Stand with your hands against a wall. 2. Extend your left / right leg behind you, and bend your front knee slightly. Your heels should be on the floor. 3. Keeping your heels on the floor and your back knee straight, shift your weight toward the wall. You should feel a gentle stretch in the back of your lower leg (calf). 4. Hold this position for __________ seconds. Repeat __________ times. Complete this exercise __________ times a day. Exercise B: Soleus stretch, standing 1. Stand with your hands against a wall. 2. Extend your left / right leg behind you, and bend your front knee  slightly. Both of your heels should be on the floor. 3. Keeping your heels on the floor, bend your back knee and shift your weight slightly over your back leg. You should feel a gentle stretch deep in your calf. 4. Hold this position for __________ seconds. Repeat __________ times. Complete this exercise __________ times a day. Strengthening exercises These exercises build strength and endurance in your lower leg. Endurance is the ability to use your muscles for a long time, even after they get tired. Exercise C: Heel walking ( dorsiflexion) Walk on your heels for __________ seconds or ___________ ft. Keep your toes as high as possible. Repeat __________ times. Complete this exercise __________ times a day. Balance exercises These exercises improve your balance and the reaction and control of your ankle to help improve stability. Exercise D: Multi-angle lunge 1. Stand with your feet together. 2. Take a step forward with your left / right leg,  and shift your weight onto that leg. Your back heel will come off the floor, and your back toes will stay in place. 3. Push off your front leg to return your front foot to the starting position next to your other foot. 4. Repeat to the side, to the back, and any other directions as told by your health care provider. Repeat in each direction __________ times. Complete this exercise __________ times a day. Exercise E: Single leg stand 1. Without shoes, stand near a railing or in a door frame. Hold onto the railing or door frame as needed. 2. Stand on your left / right foot. Keep your big toe down on the floor and try to keep your arch lifted. 3. Hold this position for __________ seconds. Repeat __________ times. Complete this exercise __________ times a day. If this exercise is too easy, you can try it with your eyes closed or while standing on a pillow. Exercise F: Inversion/eversion  You will need a balance board for this exercise. Ask your health care  provider where you can get a balance board or how you can make one. 1. Stand on a non-carpeted surface near a countertop or wall. 2. Step onto the balance board so your feet are hip-width apart. 3. Keep your feet in place and keep your upper body and hips steady. Using only your feet and ankles to move the board, do one or both of the following exercises as told by your health care provider: ? Tip the board side to side as far as you can, alternating between tipping to the left and tipping to the right. If you can, tip the board so it silently taps the floor. Do not let the board forcefully hit the floor. From time to time, pause to hold a steady position. ? Tip the board side to side so the board does not hit the floor at all. From time to time, pause to hold a steady position. Repeat the movement for each exercise __________ times. Complete each exercise __________ times a day. Exercise G: Plantar flexion/dorsiflexion  You will need a balance board for this exercise. Ask your health care provider where you can get a balance board or how you can make one. 1. Stand on a non-carpeted surface near a countertop or wall. 2. Step onto the balance board so your feet are hip-width apart. 3. Keep your feet in place and keep your upper body and hips steady. Using only your feet and ankles to move the board, do one or both of the following exercises as told by your health care provider: ? Tip the board forward and backward so the board silently taps the floor. Do not let the board forcefully hit the floor. From time to time, pause to hold a steady position. ? Tip the board forward and backward so the board does not hit the floor at all. From time to time, pause to hold a steady position. Repeat the movement for each exercise __________ times. Complete each exercise __________ times a day. This information is not intended to replace advice given to you by your health care provider. Make sure you discuss any  questions you have with your health care provider. Document Released: 05/30/2005 Document Revised: 10/15/2015 Document Reviewed: 12/22/2014 Elsevier Interactive Patient Education  2018 [employer].

## 2017-07-12 DIAGNOSIS — N200 Calculus of kidney: Secondary | ICD-10-CM | POA: Diagnosis not present

## 2017-07-24 ENCOUNTER — Encounter: Payer: Self-pay | Admitting: Family Medicine

## 2017-07-24 ENCOUNTER — Ambulatory Visit (INDEPENDENT_AMBULATORY_CARE_PROVIDER_SITE_OTHER): Payer: Medicare Other | Admitting: Family Medicine

## 2017-07-24 VITALS — BP 139/79 | HR 79 | Ht 63.0 in | Wt 139.0 lb

## 2017-07-24 DIAGNOSIS — S93492A Sprain of other ligament of left ankle, initial encounter: Secondary | ICD-10-CM

## 2017-07-24 NOTE — Patient Instructions (Signed)
Thank you for coming in today. Recheck in 6 weeks.  Continue the exercises.  Continue the compression sleeve.  If not doing better let me know and I will order PT.     Ankle Sprain, Phase II Rehab Ask your health care provider which exercises are safe for you. Do exercises exactly as told by your health care provider and adjust them as directed. It is normal to feel mild stretching, pulling, tightness, or discomfort as you do these exercises, but you should stop right away if you feel sudden pain or your pain gets worse.Do not begin these exercises until told by your health care provider. Stretching and range of motion exercises These exercises warm up your muscles and joints and improve the movement and flexibility of your lower leg and ankle. These exercises also help to relieve pain and stiffness. Exercise A: Gastroc stretch, standing  1. Stand with your hands against a wall. 2. Extend your left / right leg behind you, and bend your front knee slightly. Your heels should be on the floor. 3. Keeping your heels on the floor and your back knee straight, shift your weight toward the wall. You should feel a gentle stretch in the back of your lower leg (calf). 4. Hold this position for __________ seconds. Repeat __________ times. Complete this exercise __________ times a day. Exercise B: Soleus stretch, standing 1. Stand with your hands against a wall. 2. Extend your left / right leg behind you, and bend your front knee slightly. Both of your heels should be on the floor. 3. Keeping your heels on the floor, bend your back knee and shift your weight slightly over your back leg. You should feel a gentle stretch deep in your calf. 4. Hold this position for __________ seconds. Repeat __________ times. Complete this exercise __________ times a day. Strengthening exercises These exercises build strength and endurance in your lower leg. Endurance is the ability to use your muscles for a long time,  even after they get tired. Exercise C: Heel walking ( dorsiflexion) Walk on your heels for __________ seconds or ___________ ft. Keep your toes as high as possible. Repeat __________ times. Complete this exercise __________ times a day. Balance exercises These exercises improve your balance and the reaction and control of your ankle to help improve stability. Exercise D: Multi-angle lunge 1. Stand with your feet together. 2. Take a step forward with your left / right leg, and shift your weight onto that leg. Your back heel will come off the floor, and your back toes will stay in place. 3. Push off your front leg to return your front foot to the starting position next to your other foot. 4. Repeat to the side, to the back, and any other directions as told by your health care provider. Repeat in each direction __________ times. Complete this exercise __________ times a day. Exercise E: Single leg stand 1. Without shoes, stand near a railing or in a door frame. Hold onto the railing or door frame as needed. 2. Stand on your left / right foot. Keep your big toe down on the floor and try to keep your arch lifted. 3. Hold this position for __________ seconds. Repeat __________ times. Complete this exercise __________ times a day. If this exercise is too easy, you can try it with your eyes closed or while standing on a pillow. Exercise F: Inversion/eversion  You will need a balance board for this exercise. Ask your health care provider where you can get  a balance board or how you can make one. 1. Stand on a non-carpeted surface near a countertop or wall. 2. Step onto the balance board so your feet are hip-width apart. 3. Keep your feet in place and keep your upper body and hips steady. Using only your feet and ankles to move the board, do one or both of the following exercises as told by your health care provider: ? Tip the board side to side as far as you can, alternating between tipping to the  left and tipping to the right. If you can, tip the board so it silently taps the floor. Do not let the board forcefully hit the floor. From time to time, pause to hold a steady position. ? Tip the board side to side so the board does not hit the floor at all. From time to time, pause to hold a steady position. Repeat the movement for each exercise __________ times. Complete each exercise __________ times a day. Exercise G: Plantar flexion/dorsiflexion  You will need a balance board for this exercise. Ask your health care provider where you can get a balance board or how you can make one. 1. Stand on a non-carpeted surface near a countertop or wall. 2. Step onto the balance board so your feet are hip-width apart. 3. Keep your feet in place and keep your upper body and hips steady. Using only your feet and ankles to move the board, do one or both of the following exercises as told by your health care provider: ? Tip the board forward and backward so the board silently taps the floor. Do not let the board forcefully hit the floor. From time to time, pause to hold a steady position. ? Tip the board forward and backward so the board does not hit the floor at all. From time to time, pause to hold a steady position. Repeat the movement for each exercise __________ times. Complete each exercise __________ times a day. This information is not intended to replace advice given to you by your health care provider. Make sure you discuss any questions you have with your health care provider. Document Released: 05/30/2005 Document Revised: 10/15/2015 Document Reviewed: 12/22/2014 Elsevier Interactive Patient Education  2018 [employer].

## 2017-07-24 NOTE — Progress Notes (Signed)
   Megan Weaver is a 62 y.o. female who [facility] today for left ankle sprain. Megan Weaver was seen on 5/10 for left ankle sprain. She has been using brace and BodyHelix ankle compression sleeve. She also have been doing home exercises plan. She notes that the pain is a lot better. She does note some continued pain but is happy with how things are going.     ROS:  As above  Exam:  BP 139/79   Pulse 79   Ht 5\' 3"  (1.6 m)   Wt 139 lb (63 kg)   BMI 24.62 kg/m  General: Well Developed, well nourished, and in no acute distress.  Neuro/Psych: Alert and oriented x3, extra-ocular muscles intact, able to move all 4 extremities, sensation grossly intact. Skin: Warm and dry, no rashes noted.  Respiratory: Not using accessory muscles, speaking in full sentences, trachea midline.  Cardiovascular: Pulses palpable, no extremity edema. Abdomen: Does not appear distended. MSK: Left ankle normal appearing.  ROM normal.  Mild TTP ATLF area.  Stable ligament exam.  Intact Pulses and cap refill.    Assessment and Plan: 62 y.o. female with left ankle sprain. Doing well.  Plan to continue current treatment.  Advance HEP program. I offered formal PT. Pt declined.  Plan to recheck in 6 weeks or sooner if needed.   I spent 15 minutes with this patient, greater than 50% was face-to-face time counseling regarding treatment plan.   Historical information moved to improve visibility of documentation.  Past Medical History:  Diagnosis Date  . Depression   . Kidney stone   . Kidney stones    No past surgical history on file. Social History   Tobacco Use  . Smoking status: Never Smoker  . Smokeless tobacco: Never Used  Substance Use Topics  . Alcohol use: Yes    Comment: weekly   family history includes Heart attack in her father; Hypertension in her father and mother.  Medications: Current Outpatient Medications  Medication Sig Dispense  Refill  . acyclovir (ZOVIRAX) 400 MG tablet Take 400 mg by mouth 5 (five) times daily.    . ARIPiprazole (ABILIFY) 5 MG tablet Take 5 mg by mouth daily.    . Carboxymethylcellulose Sodium (EYE DROPS OP) Apply to eye.    . Cholecalciferol (VITAMIN D3) 2000 units TABS Take by mouth.    . docusate sodium (COLACE) 100 MG capsule Take 100 mg by mouth 2 (two) times daily.    Marland Kitchen. estradiol-norethindrone (ACTIVELLA) 1-0.5 MG tablet Take 1 tablet by mouth daily.    Marland Kitchen. L-Methylfolate-Algae (DEPLIN 15 PO) Take by mouth.    . levothyroxine (SYNTHROID, LEVOTHROID) 75 MCG tablet   1  . montelukast (SINGULAIR) 10 MG tablet Take 10 mg by mouth at bedtime.    . NON FORMULARY     . pantoprazole (PROTONIX) 40 MG tablet Take 40 mg by mouth daily.    . Probiotic Product (PROBIOTIC PO) Take by mouth.    . ranitidine (ZANTAC) 150 MG tablet Take 150 mg by mouth 2 (two) times daily.    . traZODone (DESYREL) 50 MG tablet Take 50 mg by mouth at bedtime.    Marland Kitchen. venlafaxine (EFFEXOR) 100 MG tablet Take 150 mg by mouth 2 (two) times daily.      No current facility-administered medications for this visit.    No Known Allergies    Discussed warning signs or symptoms. Please see discharge instructions. Patient expresses understanding.

## 2017-10-13 DIAGNOSIS — F329 Major depressive disorder, single episode, unspecified: Secondary | ICD-10-CM | POA: Diagnosis not present

## 2017-10-13 DIAGNOSIS — F319 Bipolar disorder, unspecified: Secondary | ICD-10-CM | POA: Diagnosis not present

## 2017-10-13 DIAGNOSIS — Z1211 Encounter for screening for malignant neoplasm of colon: Secondary | ICD-10-CM | POA: Diagnosis not present

## 2017-10-13 DIAGNOSIS — K279 Peptic ulcer, site unspecified, unspecified as acute or chronic, without hemorrhage or perforation: Secondary | ICD-10-CM | POA: Diagnosis not present

## 2017-11-07 ENCOUNTER — Other Ambulatory Visit (HOSPITAL_COMMUNITY)
Admission: RE | Admit: 2017-11-07 | Discharge: 2017-11-07 | Disposition: A | Discharge status: Home / Self Care | Payer: Medicare Other | Source: Ambulatory Visit | Attending: Family Medicine | Admitting: Family Medicine

## 2017-11-07 ENCOUNTER — Other Ambulatory Visit: Payer: Self-pay | Admitting: Family Medicine

## 2017-11-07 DIAGNOSIS — Z0000 Encounter for general adult medical examination without abnormal findings: Secondary | ICD-10-CM | POA: Diagnosis not present

## 2017-11-07 DIAGNOSIS — Z23 Encounter for immunization: Secondary | ICD-10-CM | POA: Diagnosis not present

## 2017-11-07 DIAGNOSIS — Z124 Encounter for screening for malignant neoplasm of cervix: Secondary | ICD-10-CM | POA: Diagnosis not present

## 2017-11-07 DIAGNOSIS — Z6825 Body mass index (BMI) 25.0-25.9, adult: Secondary | ICD-10-CM | POA: Diagnosis not present

## 2017-11-07 DIAGNOSIS — Z1389 Encounter for screening for other disorder: Secondary | ICD-10-CM | POA: Diagnosis not present

## 2017-11-09 LAB — CYTOLOGY - PAP
Adequacy: ABSENT
Diagnosis: NEGATIVE
HPV: NOT DETECTED

## 2017-12-13 DIAGNOSIS — M8588 Other specified disorders of bone density and structure, other site: Secondary | ICD-10-CM | POA: Diagnosis not present

## 2017-12-13 DIAGNOSIS — E2839 Other primary ovarian failure: Secondary | ICD-10-CM | POA: Diagnosis not present

## 2017-12-13 DIAGNOSIS — Z780 Asymptomatic menopausal state: Secondary | ICD-10-CM | POA: Diagnosis not present

## 2017-12-28 DIAGNOSIS — E559 Vitamin D deficiency, unspecified: Secondary | ICD-10-CM | POA: Diagnosis not present

## 2017-12-29 DIAGNOSIS — R300 Dysuria: Secondary | ICD-10-CM | POA: Diagnosis not present

## 2017-12-29 DIAGNOSIS — N3001 Acute cystitis with hematuria: Secondary | ICD-10-CM | POA: Diagnosis not present

## 2017-12-29 DIAGNOSIS — Z1231 Encounter for screening mammogram for malignant neoplasm of breast: Secondary | ICD-10-CM | POA: Diagnosis not present

## 2018-01-15 DIAGNOSIS — D485 Neoplasm of uncertain behavior of skin: Secondary | ICD-10-CM | POA: Diagnosis not present

## 2018-01-15 DIAGNOSIS — S0080XA Unspecified superficial injury of other part of head, initial encounter: Secondary | ICD-10-CM | POA: Diagnosis not present

## 2018-01-15 DIAGNOSIS — D225 Melanocytic nevi of trunk: Secondary | ICD-10-CM | POA: Diagnosis not present

## 2018-01-15 DIAGNOSIS — L821 Other seborrheic keratosis: Secondary | ICD-10-CM | POA: Diagnosis not present

## 2018-01-15 DIAGNOSIS — Z08 Encounter for follow-up examination after completed treatment for malignant neoplasm: Secondary | ICD-10-CM | POA: Diagnosis not present

## 2018-02-01 DIAGNOSIS — R05 Cough: Secondary | ICD-10-CM | POA: Diagnosis not present

## 2018-02-01 DIAGNOSIS — J310 Chronic rhinitis: Secondary | ICD-10-CM | POA: Diagnosis not present

## 2018-02-16 DIAGNOSIS — R05 Cough: Secondary | ICD-10-CM | POA: Diagnosis not present

## 2018-02-28 DIAGNOSIS — R05 Cough: Secondary | ICD-10-CM | POA: Diagnosis not present

## 2018-02-28 DIAGNOSIS — K219 Gastro-esophageal reflux disease without esophagitis: Secondary | ICD-10-CM | POA: Diagnosis not present

## 2018-02-28 DIAGNOSIS — J310 Chronic rhinitis: Secondary | ICD-10-CM | POA: Diagnosis not present

## 2018-03-27 DIAGNOSIS — Z86010 Personal history of colonic polyps: Secondary | ICD-10-CM | POA: Diagnosis not present

## 2018-04-25 DIAGNOSIS — R05 Cough: Secondary | ICD-10-CM | POA: Diagnosis not present

## 2018-04-25 DIAGNOSIS — R911 Solitary pulmonary nodule: Secondary | ICD-10-CM | POA: Diagnosis not present

## 2018-11-26 DIAGNOSIS — H04123 Dry eye syndrome of bilateral lacrimal glands: Secondary | ICD-10-CM | POA: Diagnosis not present

## 2018-11-27 DIAGNOSIS — Z0000 Encounter for general adult medical examination without abnormal findings: Secondary | ICD-10-CM | POA: Diagnosis not present

## 2018-12-10 DIAGNOSIS — E039 Hypothyroidism, unspecified: Secondary | ICD-10-CM | POA: Diagnosis not present

## 2018-12-10 DIAGNOSIS — A6004 Herpesviral vulvovaginitis: Secondary | ICD-10-CM | POA: Diagnosis not present

## 2018-12-10 DIAGNOSIS — K279 Peptic ulcer, site unspecified, unspecified as acute or chronic, without hemorrhage or perforation: Secondary | ICD-10-CM | POA: Diagnosis not present

## 2018-12-10 DIAGNOSIS — E559 Vitamin D deficiency, unspecified: Secondary | ICD-10-CM | POA: Diagnosis not present

## 2018-12-13 DIAGNOSIS — Z23 Encounter for immunization: Secondary | ICD-10-CM | POA: Diagnosis not present

## 2018-12-13 DIAGNOSIS — A6004 Herpesviral vulvovaginitis: Secondary | ICD-10-CM | POA: Diagnosis not present

## 2018-12-13 DIAGNOSIS — E559 Vitamin D deficiency, unspecified: Secondary | ICD-10-CM | POA: Diagnosis not present

## 2018-12-13 DIAGNOSIS — E039 Hypothyroidism, unspecified: Secondary | ICD-10-CM | POA: Diagnosis not present

## 2019-01-21 DIAGNOSIS — Z08 Encounter for follow-up examination after completed treatment for malignant neoplasm: Secondary | ICD-10-CM | POA: Diagnosis not present

## 2019-01-21 DIAGNOSIS — L821 Other seborrheic keratosis: Secondary | ICD-10-CM | POA: Diagnosis not present

## 2019-01-21 DIAGNOSIS — D239 Other benign neoplasm of skin, unspecified: Secondary | ICD-10-CM | POA: Diagnosis not present

## 2019-01-21 DIAGNOSIS — Z872 Personal history of diseases of the skin and subcutaneous tissue: Secondary | ICD-10-CM | POA: Diagnosis not present

## 2019-01-29 DIAGNOSIS — Z1231 Encounter for screening mammogram for malignant neoplasm of breast: Secondary | ICD-10-CM | POA: Diagnosis not present

## 2019-03-10 IMAGING — DX DG ANKLE COMPLETE 3+V*L*
3 series · 3 of 3 positions shown · non-contrast
Comparison: 06/20/2017

CLINICAL DATA: Follow-up sprain versus avulsion, rolled LEFT foot 2
weeks ago, lateral malleolar swelling bruising and pain extending
across toes, bruising from ankle to distal tibia

EXAM:
LEFT ANKLE COMPLETE - 3+ VIEW

[ankle ap]
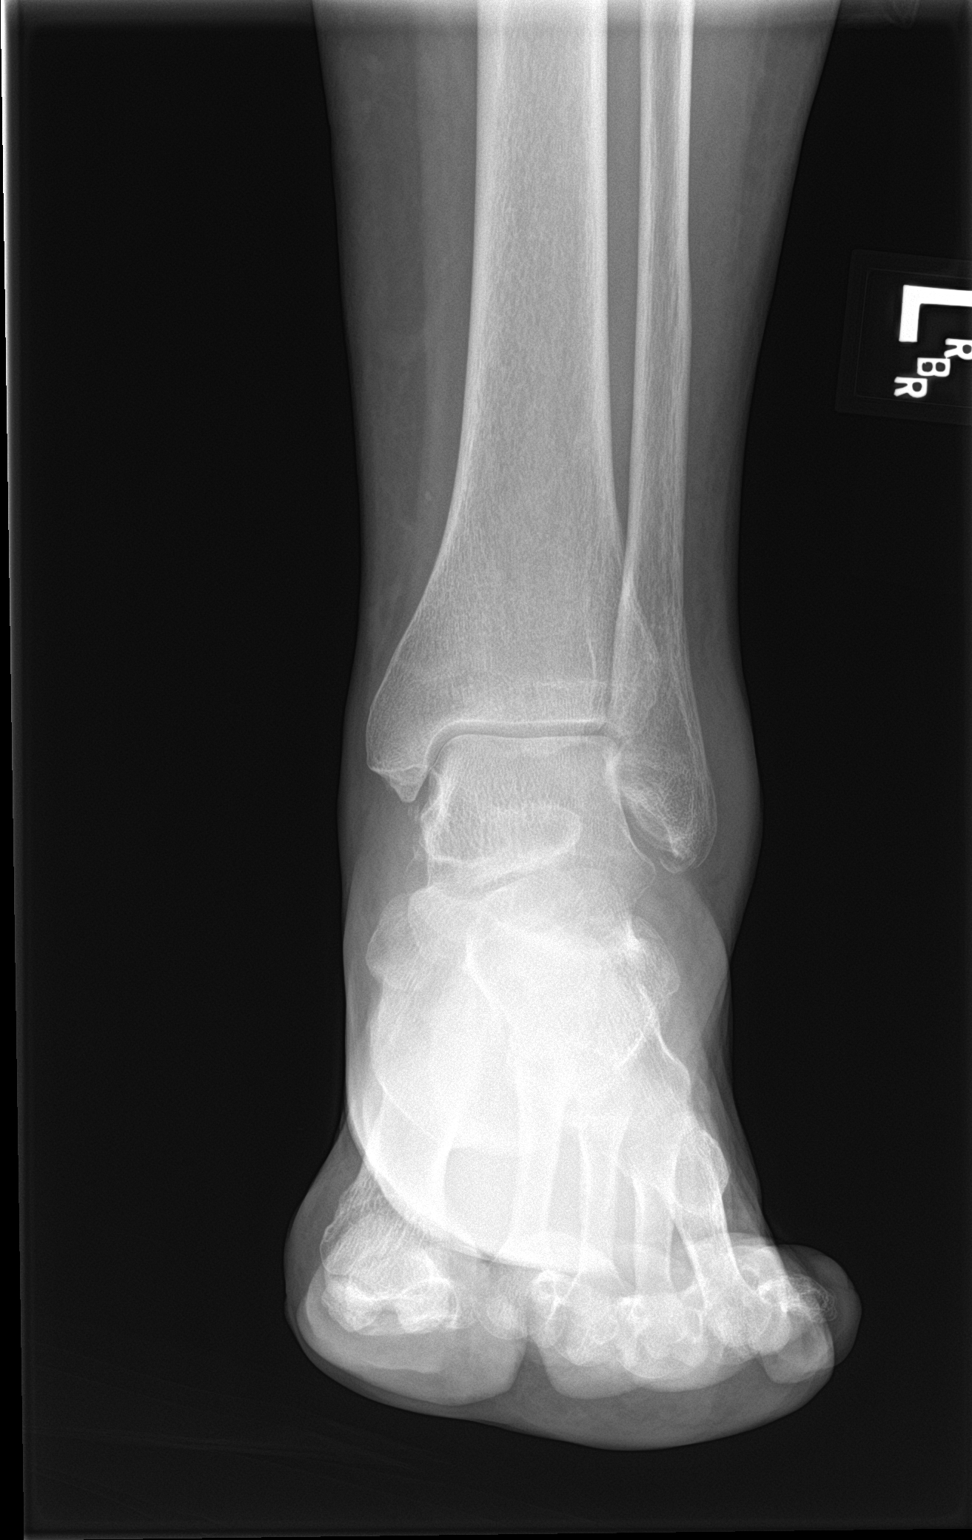

[ankle obl]
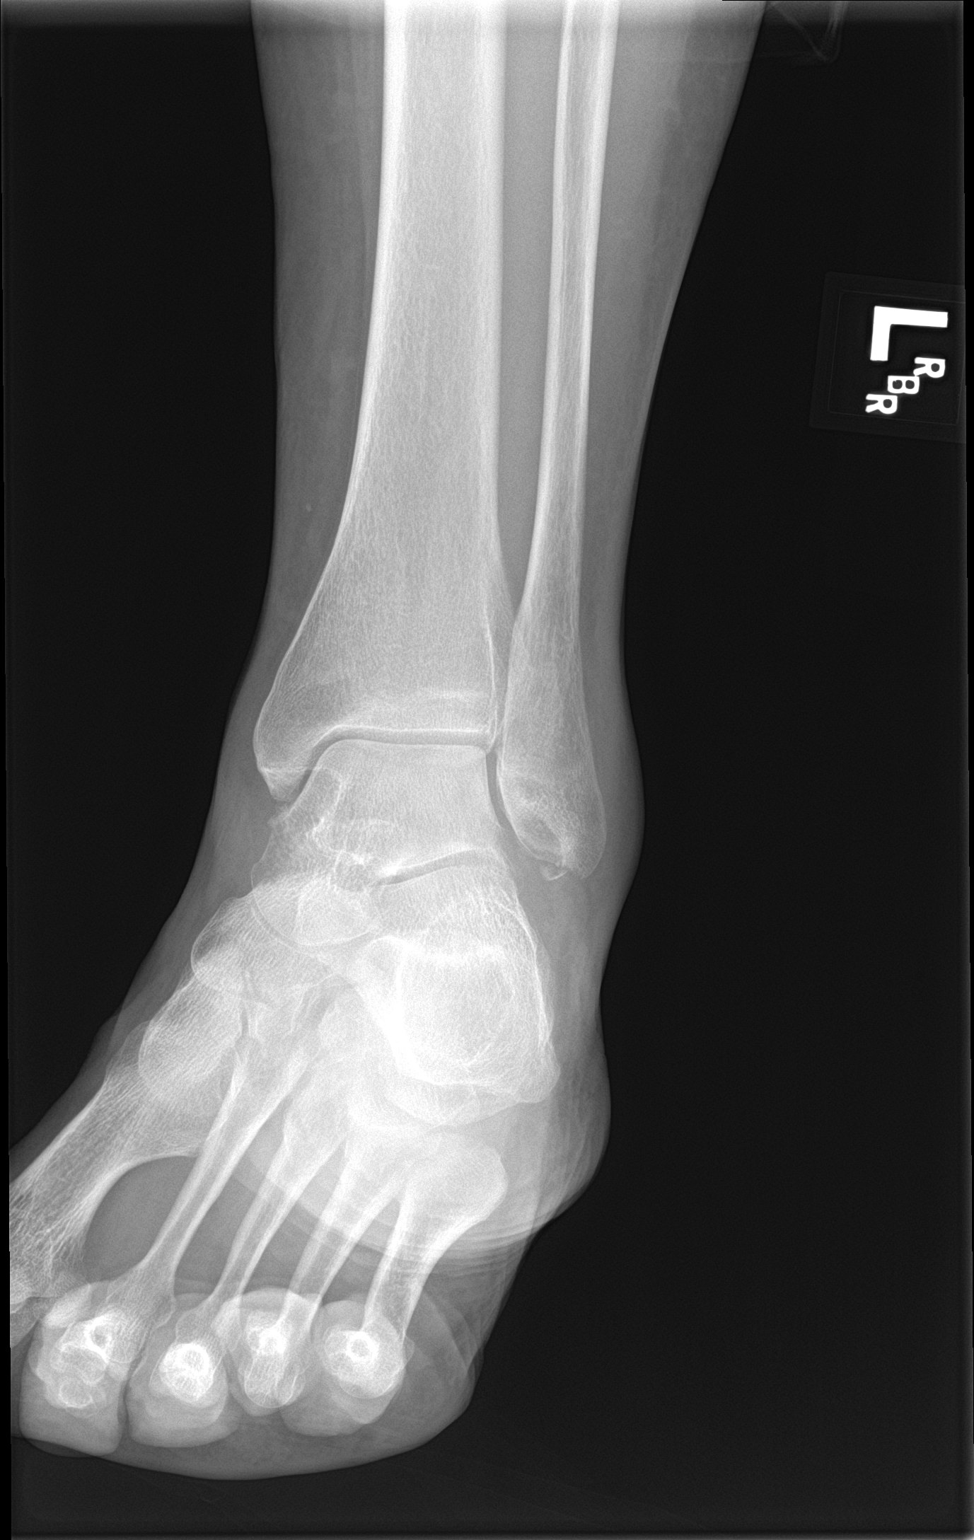

[ankle lat]
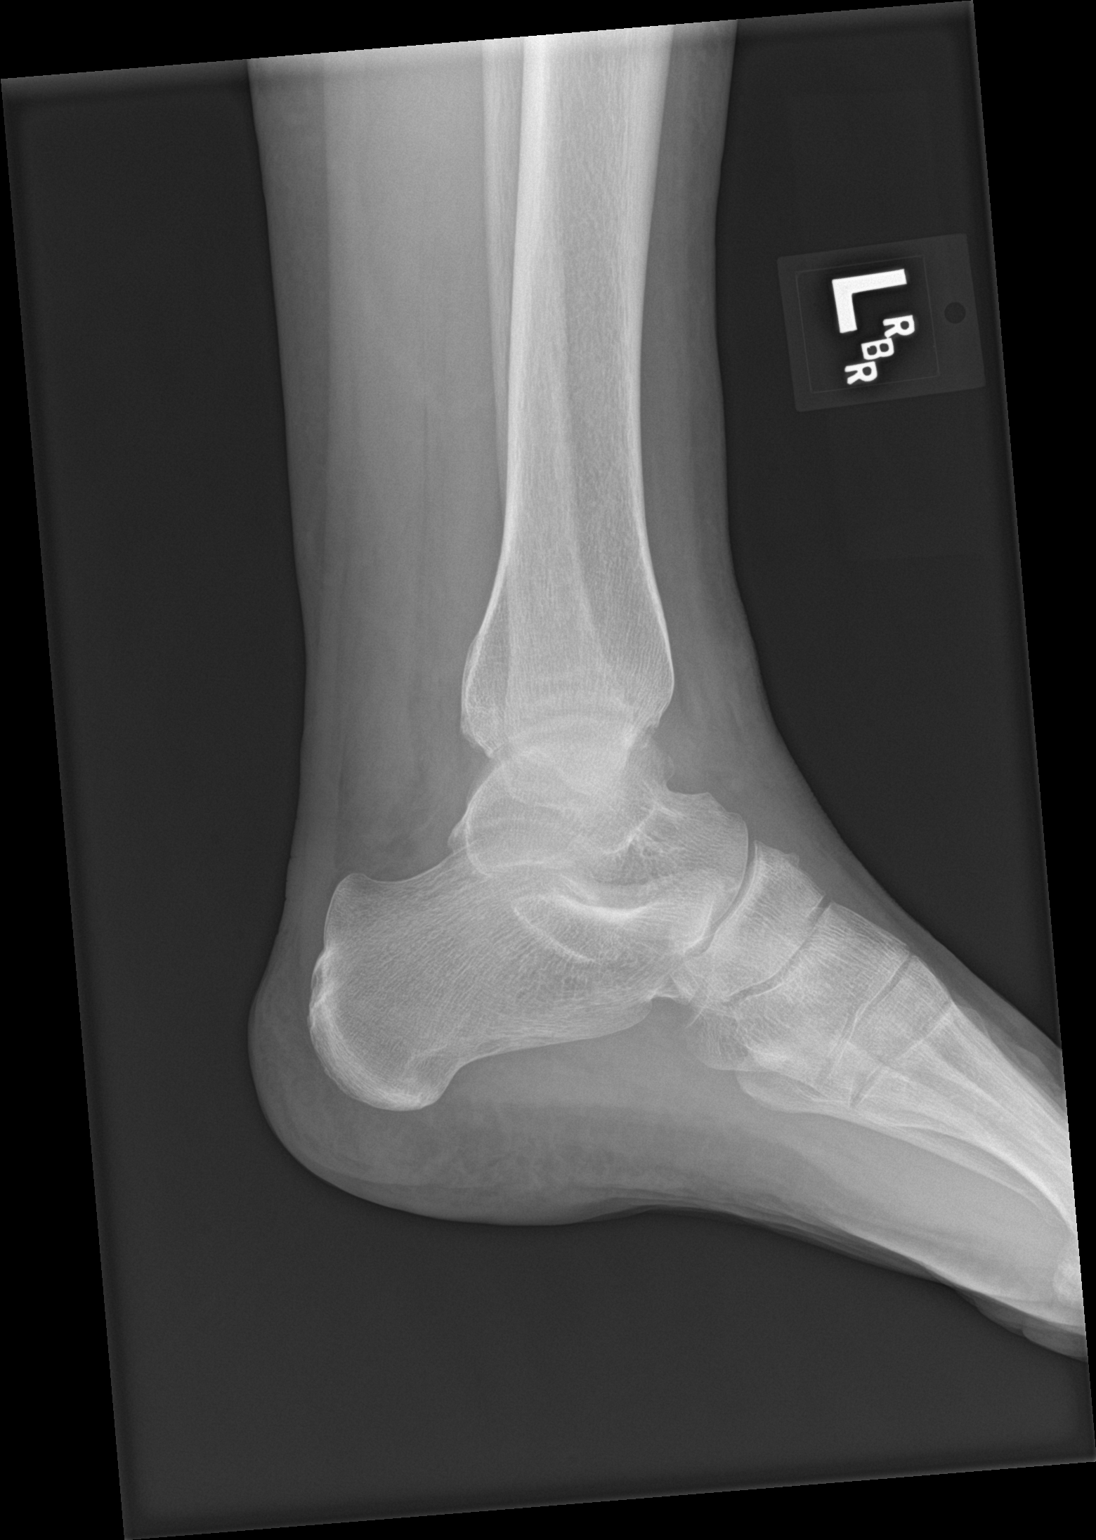

[3 of 3 positions shown; findings below may reference images not displayed]

FINDINGS: Question mild osseous demineralization.

Diffuse soft tissue swelling.

Avulsion fracture at tip of lateral malleolus.

Again identified small calcific density at the medial margin of the
talus, appears corticated, doubt acute fracture fragment and no
donor site is seen.

No additional fracture, dislocation, or bone destruction.
IMPRESSION: Avulsion fracture at tip of lateral malleolus LEFT ankle, minimally
displaced.

No other definite fractures visualized.

## 2019-03-10 IMAGING — DX DG FOOT COMPLETE 3+V*L*
3 series · 3 of 3 positions shown · non-contrast
Comparison: None

CLINICAL DATA: Pain swelling and bruising across toes extending to
ankle, rolled LEFT foot 2 weeks ago

EXAM:
LEFT FOOT - COMPLETE 3+ VIEW

[foot ap]
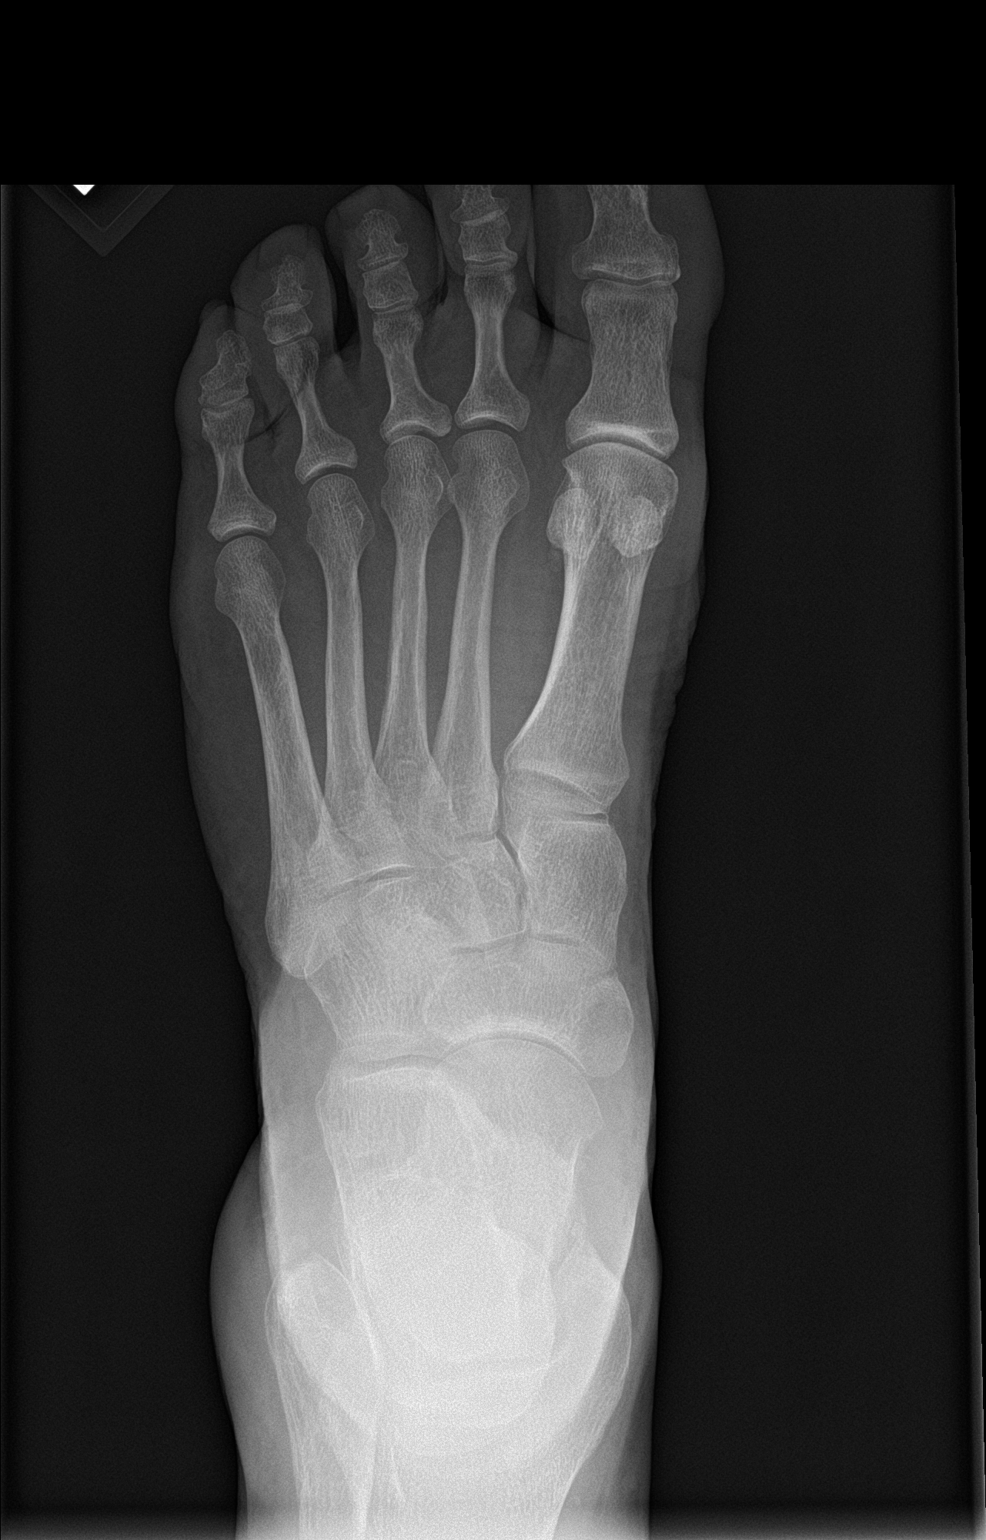

[foot obl]
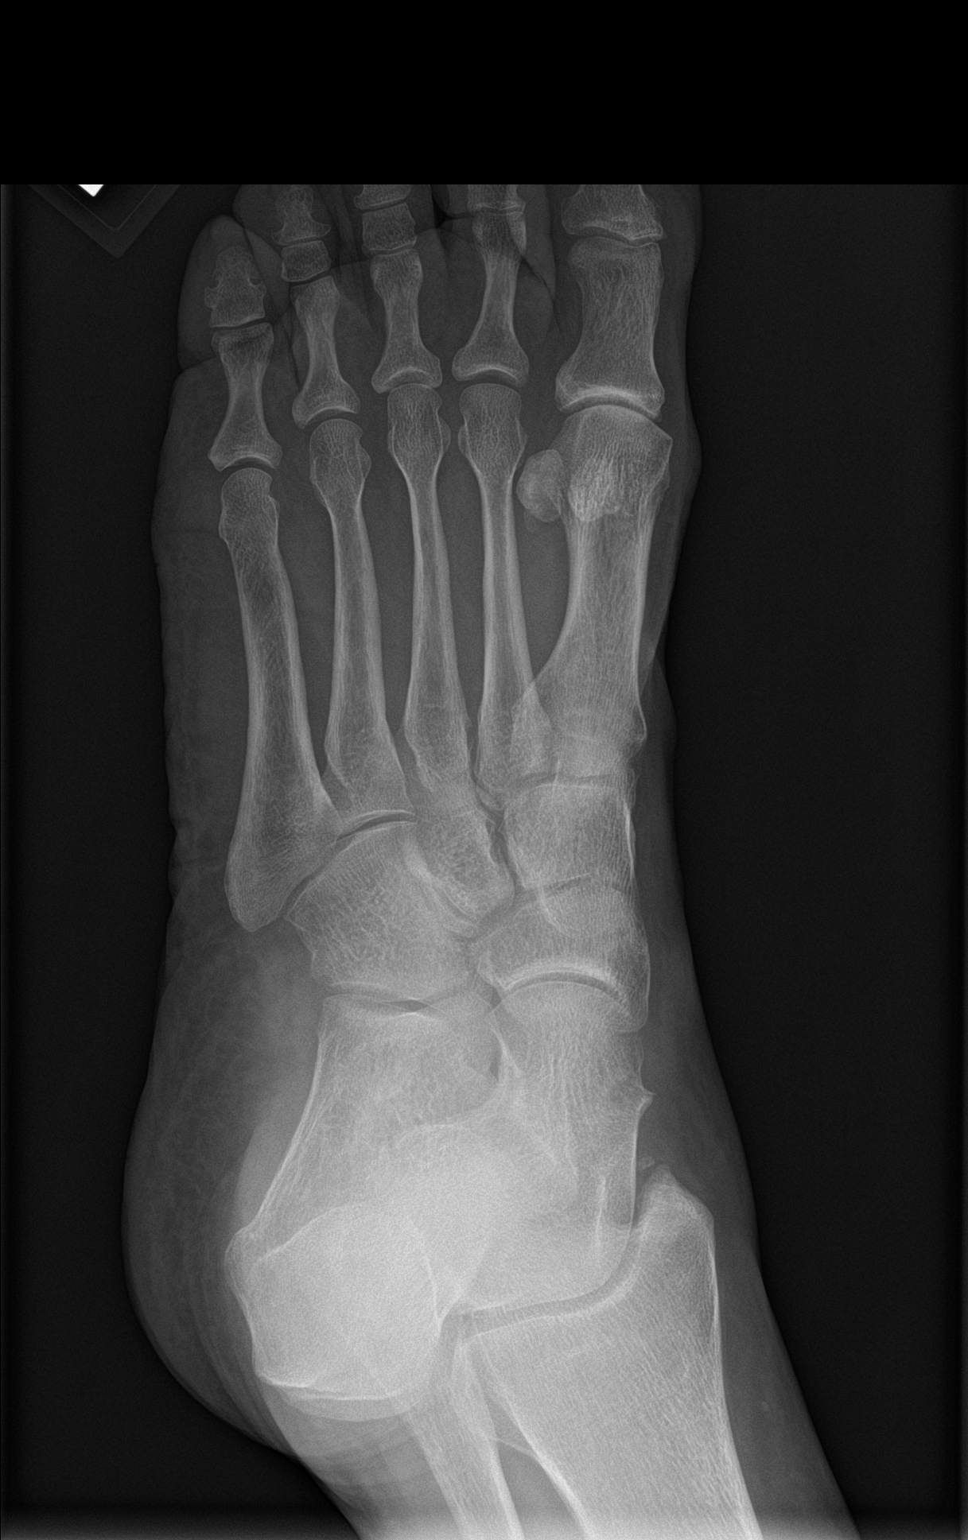

[foot lat]
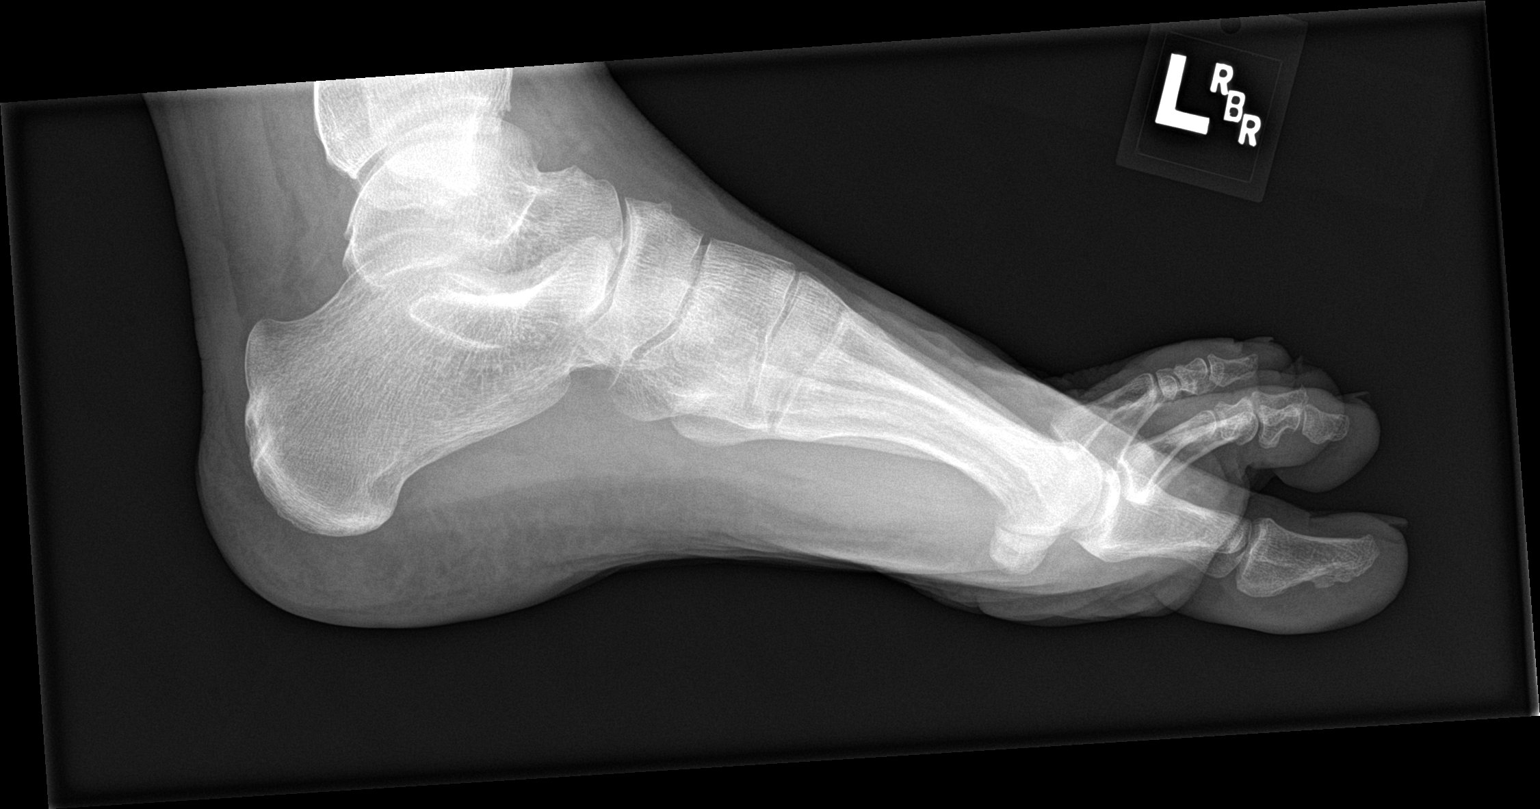

[3 of 3 positions shown; findings below may reference images not displayed]

FINDINGS: Avulsion fracture at tip of lateral malleolus less well visualized
than on ankle radiographs.

Osseous mineralization low normal for technique.

Joint spaces preserved.

No acute fracture, dislocation, or bone destruction.
IMPRESSION: Avulsion fracture at tip of lateral malleolus LEFT ankle.

No additional foot fracture or dislocation identified.

## 2019-12-27 DIAGNOSIS — K279 Peptic ulcer, site unspecified, unspecified as acute or chronic, without hemorrhage or perforation: Secondary | ICD-10-CM | POA: Diagnosis not present

## 2019-12-27 DIAGNOSIS — E039 Hypothyroidism, unspecified: Secondary | ICD-10-CM | POA: Diagnosis not present

## 2019-12-27 DIAGNOSIS — Z79899 Other long term (current) drug therapy: Secondary | ICD-10-CM | POA: Diagnosis not present

## 2019-12-27 DIAGNOSIS — N951 Menopausal and female climacteric states: Secondary | ICD-10-CM | POA: Diagnosis not present

## 2019-12-27 DIAGNOSIS — A6004 Herpesviral vulvovaginitis: Secondary | ICD-10-CM | POA: Diagnosis not present

## 2020-01-21 DIAGNOSIS — Z0000 Encounter for general adult medical examination without abnormal findings: Secondary | ICD-10-CM | POA: Diagnosis not present

## 2020-01-22 DIAGNOSIS — Z872 Personal history of diseases of the skin and subcutaneous tissue: Secondary | ICD-10-CM | POA: Diagnosis not present

## 2020-01-22 DIAGNOSIS — D239 Other benign neoplasm of skin, unspecified: Secondary | ICD-10-CM | POA: Diagnosis not present

## 2020-01-22 DIAGNOSIS — Z08 Encounter for follow-up examination after completed treatment for malignant neoplasm: Secondary | ICD-10-CM | POA: Diagnosis not present

## 2020-02-10 DIAGNOSIS — Z1231 Encounter for screening mammogram for malignant neoplasm of breast: Secondary | ICD-10-CM | POA: Diagnosis not present

## 2020-08-26 DIAGNOSIS — H04123 Dry eye syndrome of bilateral lacrimal glands: Secondary | ICD-10-CM | POA: Diagnosis not present

## 2020-08-31 DIAGNOSIS — H5203 Hypermetropia, bilateral: Secondary | ICD-10-CM | POA: Diagnosis not present

## 2020-09-02 ENCOUNTER — Emergency Department (HOSPITAL_BASED_OUTPATIENT_CLINIC_OR_DEPARTMENT_OTHER)
Admission: EM | Admit: 2020-09-02 | Discharge: 2020-09-02 | Disposition: A | Discharge status: Home / Self Care | Payer: Medicare Other | Attending: Emergency Medicine | Admitting: Emergency Medicine

## 2020-09-02 ENCOUNTER — Encounter (HOSPITAL_BASED_OUTPATIENT_CLINIC_OR_DEPARTMENT_OTHER): Payer: Self-pay | Admitting: Obstetrics and Gynecology

## 2020-09-02 ENCOUNTER — Other Ambulatory Visit: Payer: Self-pay

## 2020-09-02 DIAGNOSIS — Z23 Encounter for immunization: Secondary | ICD-10-CM | POA: Insufficient documentation

## 2020-09-02 DIAGNOSIS — S61451A Open bite of right hand, initial encounter: Secondary | ICD-10-CM | POA: Diagnosis not present

## 2020-09-02 DIAGNOSIS — W540XXA Bitten by dog, initial encounter: Secondary | ICD-10-CM | POA: Diagnosis not present

## 2020-09-02 DIAGNOSIS — Z79899 Other long term (current) drug therapy: Secondary | ICD-10-CM | POA: Diagnosis not present

## 2020-09-02 DIAGNOSIS — S61411A Laceration without foreign body of right hand, initial encounter: Secondary | ICD-10-CM | POA: Insufficient documentation

## 2020-09-02 DIAGNOSIS — S6991XA Unspecified injury of right wrist, hand and finger(s), initial encounter: Secondary | ICD-10-CM | POA: Diagnosis present

## 2020-09-02 MED ORDER — AMOXICILLIN-POT CLAVULANATE 875-125 MG PO TABS: 1.0000 | ORAL_TABLET | Freq: Two times a day (BID) | ORAL | 0 refills | Status: DC

## 2020-09-02 MED ORDER — AMOXICILLIN-POT CLAVULANATE 875-125 MG PO TABS
1.0000 | ORAL_TABLET | Freq: Once | ORAL | Status: AC
  Administered 2020-09-02: 1 via ORAL
  Filled 2020-09-02: qty 1

## 2020-09-02 MED ORDER — TETANUS-DIPHTH-ACELL PERTUSSIS 5-2.5-18.5 LF-MCG/0.5 IM SUSY
0.5000 mL | PREFILLED_SYRINGE | Freq: Once | INTRAMUSCULAR | Status: AC
  Administered 2020-09-02: 0.5 mL via INTRAMUSCULAR
  Filled 2020-09-02: qty 0.5

## 2020-09-02 NOTE — Discharge Instructions (Addendum)
Return for redness, drainage, fever.    

## 2020-09-02 NOTE — ED Triage Notes (Signed)
Patient reports to the ER for dog bite from her own dog.

## 2020-09-02 NOTE — ED Provider Notes (Signed)
[facility] EMERGENCY DEPT Provider Note   CSN: [id number] Arrival date & time: 09/02/20  1843     History Chief Complaint  Patient presents with   Animal Bite    Megan Weaver is a 65 y.o. female.  65 yo F with a chief complaint of a dog bite to the right hand.  This was by her own dog she was trying to go out to eat with her husband and tried to push the dog back into the door and it bit her on the hand.  She denies any other areas of injury.  Unsure of her last tetanus update.  Denies any weakness or numbness of the hand.  She is concerned because it is bled quite a bit.  Not bleeding currently.  The history is provided by the patient and the spouse.  Animal Bite Contact animal:  Dog Location:  Hand Hand injury location:  R hand Time since incident:  2 hours Pain details:    Quality:  Aching   Severity:  Mild   Timing:  Constant   Progression:  Unchanged Incident location:  Home Provoked: provoked   Notifications:  None Animal's rabies vaccination status:  Up to date Animal in possession: yes   Tetanus status:  Unknown Relieved by:  Nothing Worsened by:  Nothing Ineffective treatments:  None tried Associated symptoms: no fever       Past Medical History:  Diagnosis Date   Depression    Kidney stone    Kidney stones     There are no problems to display for this patient.   History reviewed. No pertinent surgical history.   OB History     Gravida      Para      Term      Preterm      AB      Living  0      SAB      IAB      Ectopic      Multiple      Live Births              Family History  Problem Relation Age of Onset   Hypertension Mother    Hypertension Father    Heart attack Father     Social History   Tobacco Use   Smoking status: Never   Smokeless tobacco: Never  Vaping Use   Vaping Use: Never used  Substance Use Topics   Alcohol use: Yes    Comment: weekly    Home Medications Prior to  Admission medications   Medication Sig Start Date End Date Taking? Authorizing Provider  amoxicillin-clavulanate (AUGMENTIN) 875-125 MG tablet Take 1 tablet by mouth every 12 (twelve) hours. 09/02/20  Yes Melene Plan, DO  acyclovir (ZOVIRAX) 400 MG tablet Take 400 mg by mouth 5 (five) times daily.    [provider]  ARIPiprazole (ABILIFY) 5 MG tablet Take 5 mg by mouth daily.    [provider]  Carboxymethylcellulose Sodium (EYE DROPS OP) Apply to eye.    [provider]  Cholecalciferol (VITAMIN D3) 2000 units TABS Take by mouth.    [provider]  docusate sodium (COLACE) 100 MG capsule Take 100 mg by mouth 2 (two) times daily.    [provider]  estradiol-norethindrone (ACTIVELLA) 1-0.5 MG tablet Take 1 tablet by mouth daily.    [provider]  L-Methylfolate-Algae (DEPLIN 15 PO) Take by mouth.    [provider]  levothyroxine (SYNTHROID,  LEVOTHROID) 75 MCG tablet  04/03/17   [provider]  montelukast (SINGULAIR) 10 MG tablet Take 10 mg by mouth at bedtime.    [provider]  NON FORMULARY     [provider]  pantoprazole (PROTONIX) 40 MG tablet Take 40 mg by mouth daily.    [provider]  Probiotic Product (PROBIOTIC PO) Take by mouth.    [provider]  ranitidine (ZANTAC) 150 MG tablet Take 150 mg by mouth 2 (two) times daily.    [provider]  traZODone (DESYREL) 50 MG tablet Take 50 mg by mouth at bedtime.    [provider]  venlafaxine (EFFEXOR) 100 MG tablet Take 150 mg by mouth 2 (two) times daily.     [provider]    Allergies    Patient has no known allergies.  Review of Systems   Review of Systems  Constitutional:  Negative for chills and fever.  HENT:  Negative for congestion and rhinorrhea.   Eyes:  Negative for redness and visual disturbance.  Respiratory:  Negative for shortness of breath and wheezing.   Cardiovascular:   Negative for chest pain and palpitations.  Gastrointestinal:  Negative for nausea and vomiting.  Genitourinary:  Negative for dysuria and urgency.  Musculoskeletal:  Positive for arthralgias and myalgias.  Skin:  Negative for pallor and wound.  Neurological:  Negative for dizziness and headaches.   Physical Exam Updated Vital Signs BP 136/74 (BP Location: Left Arm)   Pulse 85   Temp 98.5 F (36.9 C)   Resp 18   Ht 5\' 3"  (1.6 m)   Wt 59 kg   SpO2 98%   BMI 23.03 kg/m   Physical Exam Vitals and nursing note reviewed.  Constitutional:      General: She is not in acute distress.    Appearance: She is well-developed. She is not diaphoretic.  HENT:     Head: Normocephalic and atraumatic.  Eyes:     Pupils: Pupils are equal, round, and reactive to light.  Cardiovascular:     Rate and Rhythm: Normal rate and regular rhythm.     Heart sounds: No murmur heard.   No friction rub. No gallop.  Pulmonary:     Effort: Pulmonary effort is normal.     Breath sounds: No wheezing or rales.  Abdominal:     General: There is no distension.     Palpations: Abdomen is soft.     Tenderness: There is no abdominal tenderness.  Musculoskeletal:        General: Tenderness present.     Cervical back: Normal range of motion and neck supple.     Comments: Puncture wound noted to the MCP of the fifth digits of the right hand along the ulnar aspect full range of motion of the pinky no numbness cap refill less than 2 seconds.  Skin:    General: Skin is warm and dry.  Neurological:     Mental Status: She is alert and oriented to person, place, and time.  Psychiatric:        Behavior: Behavior normal.    ED Results / Procedures / Treatments   Labs (all labs ordered are listed, but only abnormal results are displayed) Labs Reviewed - No data to display  EKG None  Radiology No results found.  Procedures Procedures   Medications Ordered in ED Medications  Tdap (BOOSTRIX) injection 0.5 mL  (0.5 mLs Intramuscular Given 09/02/20 2027)  amoxicillin-clavulanate (AUGMENTIN) 875-125 MG  per tablet 1 tablet (1 tablet Oral Given 09/02/20 2027)    ED Course  I have reviewed the triage vital signs and the nursing notes.  Pertinent labs & imaging results that were available during my care of the patient were reviewed by me and considered in my medical decision making (see chart for details).    MDM Rules/Calculators/A&P                          65 yo F with a chief complaints of a dog bite.  We will leave it open.  Started on antibiotics.  Tetanus updated.  11:25 PM:  I have discussed the diagnosis/risks/treatment options with the patient and family and believe the pt to be eligible for discharge home to follow-up with PCP. We also discussed returning to the ED immediately if new or worsening sx occur. We discussed the sx which are most concerning (e.g., sudden worsening pain, fever, inability to tolerate by mouth, redness, drainage, fever) that necessitate immediate return. Medications administered to the patient during their visit and any new prescriptions provided to the patient are listed below.  Medications given during this visit Medications  Tdap (BOOSTRIX) injection 0.5 mL (0.5 mLs Intramuscular Given 09/02/20 2027)  amoxicillin-clavulanate (AUGMENTIN) 875-125 MG per tablet 1 tablet (1 tablet Oral Given 09/02/20 2027)     The patient appears reasonably screen and/or stabilized for discharge and I doubt any other medical condition or other [facility] requiring further screening, evaluation, or treatment in the ED at this time prior to discharge.   Final Clinical Impression(s) / ED Diagnoses Final diagnoses:  Dog bite, initial encounter  Laceration of right hand without foreign body, initial encounter    Rx / DC Orders ED Discharge Orders          Ordered    amoxicillin-clavulanate (AUGMENTIN) 875-125 MG tablet  Every 12 hours        09/02/20 2016             Melene Plan,  DO 09/02/20 2325

## 2020-11-04 DIAGNOSIS — R053 Chronic cough: Secondary | ICD-10-CM | POA: Diagnosis not present

## 2020-11-04 DIAGNOSIS — J343 Hypertrophy of nasal turbinates: Secondary | ICD-10-CM | POA: Diagnosis not present

## 2020-11-12 DIAGNOSIS — R519 Headache, unspecified: Secondary | ICD-10-CM | POA: Diagnosis not present

## 2020-11-12 DIAGNOSIS — R053 Chronic cough: Secondary | ICD-10-CM | POA: Diagnosis not present

## 2020-11-24 DIAGNOSIS — J343 Hypertrophy of nasal turbinates: Secondary | ICD-10-CM | POA: Diagnosis not present

## 2020-12-07 NOTE — Progress Notes (Signed)
 Otolaryngology Office Visit Note   Post Op Visit  Assessment and Plan Problem List Items Addressed This Visit      Respiratory   RESOLVED: Hypertrophy of inferior nasal turbinate - Primary     HPI: Two weeks out from nasal surgery.  No problems.  Congestion with moderate improvement. EXAM shows Mild crusting.  Good nasal airway.   PLAN: Follow-up 1 month if still having problems.  Otherwise follow-up as needed.  HPI Chief Complaint  Patient presents with  . Post-op Exam    Pt is here for 2 wk PO turbinate reduction (11/24/20).  Pt states she is doing well, but still a little stuffy on the Lt nostril.    Alm Ada, MD   Electronically signed by: Alm Edsel Ada Mickey., MD 12/07/20 [phone]

## 2020-12-18 NOTE — Telephone Encounter (Signed)
 Sent to pharmacy   Electronically signed by: Alm Edsel Georgina Mickey., MD 12/18/20 1141

## 2021-01-29 DIAGNOSIS — Z0000 Encounter for general adult medical examination without abnormal findings: Secondary | ICD-10-CM | POA: Diagnosis not present

## 2021-01-29 DIAGNOSIS — Z1389 Encounter for screening for other disorder: Secondary | ICD-10-CM | POA: Diagnosis not present

## 2021-02-01 DIAGNOSIS — E039 Hypothyroidism, unspecified: Secondary | ICD-10-CM | POA: Diagnosis not present

## 2021-02-01 DIAGNOSIS — K279 Peptic ulcer, site unspecified, unspecified as acute or chronic, without hemorrhage or perforation: Secondary | ICD-10-CM | POA: Diagnosis not present

## 2021-02-01 DIAGNOSIS — A6004 Herpesviral vulvovaginitis: Secondary | ICD-10-CM | POA: Diagnosis not present

## 2021-02-01 DIAGNOSIS — E559 Vitamin D deficiency, unspecified: Secondary | ICD-10-CM | POA: Diagnosis not present

## 2021-02-01 DIAGNOSIS — Z0000 Encounter for general adult medical examination without abnormal findings: Secondary | ICD-10-CM | POA: Diagnosis not present

## 2021-02-01 DIAGNOSIS — Z23 Encounter for immunization: Secondary | ICD-10-CM | POA: Diagnosis not present

## 2021-02-01 DIAGNOSIS — Z79899 Other long term (current) drug therapy: Secondary | ICD-10-CM | POA: Diagnosis not present

## 2021-02-11 DIAGNOSIS — Z1231 Encounter for screening mammogram for malignant neoplasm of breast: Secondary | ICD-10-CM | POA: Diagnosis not present

## 2021-03-18 NOTE — Progress Notes (Signed)
 error   Electronically signed by: Alm Edsel Georgina Mickey., MD 03/18/21 [phone]

## 2021-04-06 DIAGNOSIS — L84 Corns and callosities: Secondary | ICD-10-CM | POA: Diagnosis not present

## 2021-04-06 DIAGNOSIS — R208 Other disturbances of skin sensation: Secondary | ICD-10-CM | POA: Diagnosis not present

## 2021-04-06 DIAGNOSIS — L908 Other atrophic disorders of skin: Secondary | ICD-10-CM | POA: Diagnosis not present

## 2021-04-06 DIAGNOSIS — D235 Other benign neoplasm of skin of trunk: Secondary | ICD-10-CM | POA: Diagnosis not present

## 2021-05-18 ENCOUNTER — Other Ambulatory Visit (HOSPITAL_BASED_OUTPATIENT_CLINIC_OR_DEPARTMENT_OTHER): Payer: Self-pay

## 2021-05-18 MED ORDER — AMPHETAMINE-DEXTROAMPHET ER 30 MG PO CP24
ORAL_CAPSULE | ORAL | 0 refills | Status: DC
  Filled 2021-05-18: qty 30, 30d supply, fill #0

## 2021-06-10 DIAGNOSIS — T43625A Adverse effect of amphetamines, initial encounter: Secondary | ICD-10-CM | POA: Diagnosis not present

## 2021-06-10 DIAGNOSIS — S79912A Unspecified injury of left hip, initial encounter: Secondary | ICD-10-CM | POA: Diagnosis not present

## 2021-06-10 NOTE — ED Notes (Signed)
 Pt stated that she did not need a wheelchair for discharge

## 2021-06-10 NOTE — ED Notes (Signed)
 Pt is calm and appears to be sleepy

## 2021-06-23 ENCOUNTER — Other Ambulatory Visit (HOSPITAL_BASED_OUTPATIENT_CLINIC_OR_DEPARTMENT_OTHER): Payer: Self-pay

## 2021-06-23 MED ORDER — AMPHETAMINE-DEXTROAMPHET ER 20 MG PO CP24
ORAL_CAPSULE | ORAL | 0 refills | Status: DC
  Filled 2021-06-23: qty 30, 30d supply, fill #0

## 2021-06-24 DIAGNOSIS — M545 Low back pain, unspecified: Secondary | ICD-10-CM | POA: Diagnosis not present

## 2021-07-12 DIAGNOSIS — M5416 Radiculopathy, lumbar region: Secondary | ICD-10-CM | POA: Diagnosis not present

## 2021-07-21 DIAGNOSIS — M5416 Radiculopathy, lumbar region: Secondary | ICD-10-CM | POA: Diagnosis not present

## 2021-07-28 DIAGNOSIS — M5416 Radiculopathy, lumbar region: Secondary | ICD-10-CM | POA: Diagnosis not present

## 2021-08-05 ENCOUNTER — Other Ambulatory Visit (HOSPITAL_BASED_OUTPATIENT_CLINIC_OR_DEPARTMENT_OTHER): Payer: Self-pay

## 2021-08-05 MED ORDER — AMPHETAMINE-DEXTROAMPHET ER 20 MG PO CP24
ORAL_CAPSULE | ORAL | 0 refills | Status: DC
  Filled 2021-08-05: qty 30, 30d supply, fill #0

## 2021-09-03 ENCOUNTER — Other Ambulatory Visit (HOSPITAL_BASED_OUTPATIENT_CLINIC_OR_DEPARTMENT_OTHER): Payer: Self-pay

## 2021-09-03 MED ORDER — AMPHETAMINE-DEXTROAMPHET ER 20 MG PO CP24
ORAL_CAPSULE | ORAL | 0 refills | Status: DC
  Filled 2021-09-06: qty 30, 30d supply, fill #0

## 2021-09-06 ENCOUNTER — Other Ambulatory Visit (HOSPITAL_BASED_OUTPATIENT_CLINIC_OR_DEPARTMENT_OTHER): Payer: Self-pay

## 2021-10-05 ENCOUNTER — Other Ambulatory Visit (HOSPITAL_BASED_OUTPATIENT_CLINIC_OR_DEPARTMENT_OTHER): Payer: Self-pay

## 2021-10-05 MED ORDER — AMPHETAMINE-DEXTROAMPHET ER 20 MG PO CP24
ORAL_CAPSULE | ORAL | 0 refills | Status: DC
  Filled 2021-10-05: qty 30, 30d supply, fill #0

## 2021-10-06 ENCOUNTER — Other Ambulatory Visit (HOSPITAL_BASED_OUTPATIENT_CLINIC_OR_DEPARTMENT_OTHER): Payer: Self-pay

## 2021-11-02 ENCOUNTER — Other Ambulatory Visit (HOSPITAL_BASED_OUTPATIENT_CLINIC_OR_DEPARTMENT_OTHER): Payer: Self-pay

## 2021-11-02 MED ORDER — AMPHETAMINE-DEXTROAMPHET ER 20 MG PO CP24
20.0000 mg | ORAL_CAPSULE | Freq: Every morning | ORAL | 0 refills | Status: DC
  Filled 2021-11-04: qty 30, 30d supply, fill #0

## 2021-11-04 ENCOUNTER — Other Ambulatory Visit (HOSPITAL_BASED_OUTPATIENT_CLINIC_OR_DEPARTMENT_OTHER): Payer: Self-pay

## 2021-12-03 ENCOUNTER — Other Ambulatory Visit (HOSPITAL_BASED_OUTPATIENT_CLINIC_OR_DEPARTMENT_OTHER): Payer: Self-pay

## 2021-12-03 MED ORDER — AMPHETAMINE-DEXTROAMPHET ER 30 MG PO CP24
30.0000 mg | ORAL_CAPSULE | Freq: Every morning | ORAL | 0 refills | Status: DC
  Filled 2021-12-07 (×2): qty 15, 15d supply, fill #0

## 2021-12-07 ENCOUNTER — Other Ambulatory Visit (HOSPITAL_BASED_OUTPATIENT_CLINIC_OR_DEPARTMENT_OTHER): Payer: Self-pay

## 2022-01-05 ENCOUNTER — Other Ambulatory Visit (HOSPITAL_BASED_OUTPATIENT_CLINIC_OR_DEPARTMENT_OTHER): Payer: Self-pay

## 2022-01-05 MED ORDER — AMPHETAMINE-DEXTROAMPHET ER 30 MG PO CP24
30.0000 mg | ORAL_CAPSULE | Freq: Every morning | ORAL | 0 refills | Status: DC
  Filled 2022-01-06: qty 30, 30d supply, fill #0

## 2022-01-06 ENCOUNTER — Other Ambulatory Visit (HOSPITAL_BASED_OUTPATIENT_CLINIC_OR_DEPARTMENT_OTHER): Payer: Self-pay

## 2022-02-01 DIAGNOSIS — Z1389 Encounter for screening for other disorder: Secondary | ICD-10-CM | POA: Diagnosis not present

## 2022-02-01 DIAGNOSIS — Z6823 Body mass index (BMI) 23.0-23.9, adult: Secondary | ICD-10-CM | POA: Diagnosis not present

## 2022-02-01 DIAGNOSIS — Z0000 Encounter for general adult medical examination without abnormal findings: Secondary | ICD-10-CM | POA: Diagnosis not present

## 2022-02-01 DIAGNOSIS — Z23 Encounter for immunization: Secondary | ICD-10-CM | POA: Diagnosis not present

## 2022-02-02 ENCOUNTER — Other Ambulatory Visit (HOSPITAL_BASED_OUTPATIENT_CLINIC_OR_DEPARTMENT_OTHER): Payer: Self-pay

## 2022-02-02 MED ORDER — AMPHETAMINE-DEXTROAMPHET ER 30 MG PO CP24
30.0000 mg | ORAL_CAPSULE | Freq: Every morning | ORAL | 0 refills | Status: DC
  Filled 2022-02-04: qty 30, 30d supply, fill #0

## 2022-02-03 ENCOUNTER — Other Ambulatory Visit (HOSPITAL_BASED_OUTPATIENT_CLINIC_OR_DEPARTMENT_OTHER): Payer: Self-pay

## 2022-02-04 ENCOUNTER — Other Ambulatory Visit (HOSPITAL_BASED_OUTPATIENT_CLINIC_OR_DEPARTMENT_OTHER): Payer: Self-pay

## 2022-02-04 ENCOUNTER — Other Ambulatory Visit: Payer: Self-pay

## 2022-02-07 DIAGNOSIS — M8580 Other specified disorders of bone density and structure, unspecified site: Secondary | ICD-10-CM | POA: Diagnosis not present

## 2022-02-07 DIAGNOSIS — K279 Peptic ulcer, site unspecified, unspecified as acute or chronic, without hemorrhage or perforation: Secondary | ICD-10-CM | POA: Diagnosis not present

## 2022-02-07 DIAGNOSIS — A6004 Herpesviral vulvovaginitis: Secondary | ICD-10-CM | POA: Diagnosis not present

## 2022-02-07 DIAGNOSIS — E7800 Pure hypercholesterolemia, unspecified: Secondary | ICD-10-CM | POA: Diagnosis not present

## 2022-02-07 DIAGNOSIS — E039 Hypothyroidism, unspecified: Secondary | ICD-10-CM | POA: Diagnosis not present

## 2022-02-07 DIAGNOSIS — R002 Palpitations: Secondary | ICD-10-CM | POA: Diagnosis not present

## 2022-02-16 ENCOUNTER — Other Ambulatory Visit: Payer: Self-pay | Admitting: Family Medicine

## 2022-02-16 DIAGNOSIS — E7800 Pure hypercholesterolemia, unspecified: Secondary | ICD-10-CM

## 2022-02-28 DIAGNOSIS — M25551 Pain in right hip: Secondary | ICD-10-CM | POA: Diagnosis not present

## 2022-02-28 DIAGNOSIS — M25552 Pain in left hip: Secondary | ICD-10-CM | POA: Diagnosis not present

## 2022-03-08 ENCOUNTER — Other Ambulatory Visit (HOSPITAL_BASED_OUTPATIENT_CLINIC_OR_DEPARTMENT_OTHER): Payer: Self-pay

## 2022-03-08 MED ORDER — AMPHETAMINE-DEXTROAMPHET ER 30 MG PO CP24
30.0000 mg | ORAL_CAPSULE | Freq: Every morning | ORAL | 0 refills | Status: DC
  Filled 2022-03-08: qty 30, 30d supply, fill #0

## 2022-03-10 DIAGNOSIS — Z1231 Encounter for screening mammogram for malignant neoplasm of breast: Secondary | ICD-10-CM | POA: Diagnosis not present

## 2022-03-14 ENCOUNTER — Encounter: Payer: Self-pay | Admitting: Cardiology

## 2022-03-14 ENCOUNTER — Ambulatory Visit: Payer: Medicare Other | Admitting: Cardiology

## 2022-03-14 VITALS — BP 153/76 | HR 95 | Ht 63.0 in | Wt 125.0 lb

## 2022-03-14 DIAGNOSIS — R002 Palpitations: Secondary | ICD-10-CM

## 2022-03-14 DIAGNOSIS — E782 Mixed hyperlipidemia: Secondary | ICD-10-CM | POA: Diagnosis not present

## 2022-03-14 DIAGNOSIS — R072 Precordial pain: Secondary | ICD-10-CM | POA: Diagnosis not present

## 2022-03-14 DIAGNOSIS — R030 Elevated blood-pressure reading, without diagnosis of hypertension: Secondary | ICD-10-CM | POA: Diagnosis not present

## 2022-03-14 NOTE — Progress Notes (Signed)
ID:  Megan Weaver, DOB 1955-12-17, MRN [id number]  PCP:  Pcp, No  Cardiologist:  Rex Kras, DO, [facility] (established care March 14, 2022)  REASON FOR CONSULT: Palpitations and chest pain  REQUESTING PHYSICIAN:  Kathyrn Lass, MD [address],  [place] [address]  Chief Complaint  Patient presents with   Palpitations   New Patient (Initial Visit)   Chest Pain    HPI  Megan Weaver is a 67 y.o. Caucasian female who presents to the clinic for evaluation of palpitations/chest pain at the request of Kathyrn Lass, MD. Her past medical history and cardiovascular risk factors include: ADD, depression, bipolar 2, history of gastric ulcer secondary to NSAID use, renal cyst, nephrolithiasis, family history of heart disease, hypothyroidism  Patient was referred to the practice for evaluation of palpitations and chest pain.  Palpitations: She started noticing palpitations during November/December 2023.  Her iWatch would alert her that pulse is ranging between 150-160 bpm.  This occurred 3 or 4 times over the course of the month.  Symptoms lasted for about 60 minutes.  And self-limited.  No near-syncope or syncope.  Worse with being more anxious than baseline and did improve with calming down.  Chest pain: Ongoing for the last couple months, occurs every other week, lasting for 5 to 10 minutes, no worsening factors, better with relaxing and deep breathing, not brought on by effort related activities, located substernally, intensity 2 out of 10.  No formal diagnosis of hypertension but her blood pressure has been elevated during last couple office visits.  Dad had a myocardial infarction at the age of 28.  FUNCTIONAL STATUS: Is active on a daily basis but no structured exercise program or daily routine.  ALLERGIES: No Known Allergies  MEDICATION LIST PRIOR TO VISIT: Current Meds  Medication Sig   acetaminophen (TYLENOL) 500 MG tablet Take 500 mg by mouth every 6 (six) hours as  needed.   acyclovir (ZOVIRAX) 400 MG tablet Take 400 mg by mouth 5 (five) times daily.   amphetamine-dextroamphetamine (ADDERALL XR) 30 MG 24 hr capsule Take 1 capsule (30 mg total) by mouth every morning.   ARIPiprazole (ABILIFY) 5 MG tablet Take 5 mg by mouth daily.   Ascorbic Acid (VITAMIN C) 1000 MG tablet Take 1,000 mg by mouth daily.   calcium citrate (CALCITRATE - DOSED IN MG ELEMENTAL CALCIUM) 950 (200 Ca) MG tablet Take 200 mg of elemental calcium by mouth daily.   Carboxymethylcellulose Sodium (EYE DROPS OP) Apply to eye.   Cholecalciferol (VITAMIN D3) 2000 units TABS Take by mouth.   Cinnamon 500 MG TABS Take by mouth.   docusate sodium (COLACE) 100 MG capsule Take 100 mg by mouth 2 (two) times daily.   gabapentin (NEURONTIN) 100 MG capsule Take 100 mg by mouth 3 (three) times daily.   L-Methylfolate-Algae (DEPLIN 15 PO) Take by mouth.   levothyroxine (SYNTHROID, LEVOTHROID) 75 MCG tablet    NON FORMULARY VEMMA   pantoprazole (PROTONIX) 40 MG tablet Take 40 mg by mouth daily.   Probiotic Product (PROBIOTIC PO) Take by mouth.   traZODone (DESYREL) 50 MG tablet Take 50 mg by mouth at bedtime.   venlafaxine (EFFEXOR) 100 MG tablet Take 150 mg by mouth 2 (two) times daily.      PAST MEDICAL HISTORY: Past Medical History:  Diagnosis Date   ADD (attention deficit disorder)    Bipolar 2 disorder ([place])    Depression    Gastric ulcer    Kidney stone  Kidney stones     PAST SURGICAL HISTORY: History reviewed. No pertinent surgical history.  FAMILY HISTORY: The patient family history includes Heart attack in her father; Hypertension in her father and mother.  SOCIAL HISTORY:  The patient  reports that she has never smoked. She has never used smokeless tobacco. She reports current alcohol use.  REVIEW OF SYSTEMS: Review of Systems  Cardiovascular:  Positive for chest pain and palpitations. Negative for claudication, dyspnea on exertion, irregular heartbeat, leg swelling,  near-syncope, orthopnea, paroxysmal nocturnal dyspnea and syncope.  Respiratory:  Negative for shortness of breath.   Hematologic/Lymphatic: Negative for bleeding problem.  Musculoskeletal:  Negative for muscle cramps and myalgias.  Neurological:  Negative for dizziness and light-headedness.    PHYSICAL EXAM:    03/14/2022    8:58 AM 09/02/2020    6:55 PM 09/02/2020    6:53 PM  Vitals with BMI  Height 5\' 3"  5\' 3"    Weight 125 lbs 130 lbs   BMI 22.15 23.03   Systolic 153  136  Diastolic 76  74  Pulse 95  85    Physical Exam  Constitutional: No distress.  Age appropriate, hemodynamically stable.   Neck: No JVD present.  Cardiovascular: Normal rate, regular rhythm, S1 normal, S2 normal, intact distal pulses and normal pulses. Exam reveals no gallop, no S3 and no S4.  No murmur heard. Pulmonary/Chest: Effort normal and breath sounds normal. No stridor. She has no wheezes. She has no rales.  Abdominal: Soft. Bowel sounds are normal. She exhibits no distension. There is no abdominal tenderness.  Musculoskeletal:        General: No edema.     Cervical back: Neck supple.  Neurological: She is alert and oriented to person, place, and time. She has intact cranial nerves (2-12).  Skin: Skin is warm and moist.   CARDIAC DATABASE: EKG: 03/14/2022: Sinus rhythm, 90 bpm, without underlying ischemia or injury pattern.  Echocardiogram: No results found for this or any previous visit from the past 1095 days.    Stress Testing: No results found for this or any previous visit from the past 1095 days.   Heart Catheterization: None  LABORATORY DATA: External Labs: Collected: February 12, 2022 performed at [facility]. Total cholesterol 227, triglycerides 57, LDL 125, HDL 93, non-HDL 135. TSH 1.25. BUN 23, creatinine 0.89. Sodium 143, potassium 3.9, chloride 105, bicarb 30 AST 24, ALT 18, alkaline phosphatase 100. Hemoglobin 14.1, hematocrit 42.3%   IMPRESSION:    ICD-10-CM   1.  Precordial pain  R07.2 PCV CARDIAC STRESS TEST    PCV ECHOCARDIOGRAM COMPLETE    2. Palpitations  R00.2 EKG 12-Lead    3. Blood pressure elevated without history of HTN  R03.0     4. Mixed hyperlipidemia  E78.2        RECOMMENDATIONS: Megan Weaver is a 67 y.o. Caucasian female whose past medical history and cardiac risk factors include: ADD, depression, bipolar 2, history of gastric ulcer secondary to NSAID use, renal cyst, nephrolithiasis, family history of heart disease, hypothyroidism.  Precordial pain Predominately noncardiac. Likely brought on by stress /anxiety. However given her risk factors and family history this shared decision was to proceed with additional workup. She scheduled for coronary calcium score ordered by PCP. Will proceed with an echocardiogram and GXT (Patient is able to exercise and EKG is interpretable). Patient is asked to keep a log of her blood pressures. If the symptoms increase in intensity frequency or duration or she has typical chest pain  as discussed that she is asked to go to the closest ER via EMS for further evaluation and management  Palpitations Intermittent. Currently not present. I suspect that the overall yield of a Zio patch will be low and therefore we will hold off on additional testing.  She does have an iWatch and she will try to capture an EKG when this happens. TSH and hemoglobin within acceptable limits. Outside labs reviewed  Blood pressure elevated without history of HTN Office blood pressures are not at goal. I have asked her to keep a log of her blood pressures at home and to review it with either myself or PCP. Low-salt diet recommended.  Mixed hyperlipidemia Most recent LDL is 125 mg/dL as of December 2023. 10-year risk of ASCVD is 6.7% -moderate intensity statin should be considered. Patient is scheduled for coronary calcium score will await the results.  FINAL MEDICATION LIST END OF ENCOUNTER: No orders of the  defined types were placed in this encounter.   Medications Discontinued During This Encounter  Medication Reason   amoxicillin-clavulanate (AUGMENTIN) 875-125 MG tablet Completed Course   NON FORMULARY    amphetamine-dextroamphetamine (ADDERALL XR) 30 MG 24 hr capsule    amphetamine-dextroamphetamine (ADDERALL XR) 20 MG 24 hr capsule    amphetamine-dextroamphetamine (ADDERALL XR) 30 MG 24 hr capsule    amphetamine-dextroamphetamine (ADDERALL XR) 30 MG 24 hr capsule    amphetamine-dextroamphetamine (ADDERALL XR) 20 MG 24 hr capsule    amphetamine-dextroamphetamine (ADDERALL XR) 20 MG 24 hr capsule    estradiol-norethindrone (ACTIVELLA) 1-0.5 MG tablet Completed Course   montelukast (SINGULAIR) 10 MG tablet    ranitidine (ZANTAC) 150 MG tablet      Current Outpatient Medications:    acetaminophen (TYLENOL) 500 MG tablet, Take 500 mg by mouth every 6 (six) hours as needed., Disp: , Rfl:    acyclovir (ZOVIRAX) 400 MG tablet, Take 400 mg by mouth 5 (five) times daily., Disp: , Rfl:    amphetamine-dextroamphetamine (ADDERALL XR) 30 MG 24 hr capsule, Take 1 capsule (30 mg total) by mouth every morning., Disp: 30 capsule, Rfl: 0   ARIPiprazole (ABILIFY) 5 MG tablet, Take 5 mg by mouth daily., Disp: , Rfl:    Ascorbic Acid (VITAMIN C) 1000 MG tablet, Take 1,000 mg by mouth daily., Disp: , Rfl:    calcium citrate (CALCITRATE - DOSED IN MG ELEMENTAL CALCIUM) 950 (200 Ca) MG tablet, Take 200 mg of elemental calcium by mouth daily., Disp: , Rfl:    Carboxymethylcellulose Sodium (EYE DROPS OP), Apply to eye., Disp: , Rfl:    Cholecalciferol (VITAMIN D3) 2000 units TABS, Take by mouth., Disp: , Rfl:    Cinnamon 500 MG TABS, Take by mouth., Disp: , Rfl:    docusate sodium (COLACE) 100 MG capsule, Take 100 mg by mouth 2 (two) times daily., Disp: , Rfl:    gabapentin (NEURONTIN) 100 MG capsule, Take 100 mg by mouth 3 (three) times daily., Disp: , Rfl:    L-Methylfolate-Algae (DEPLIN 15 PO), Take by  mouth., Disp: , Rfl:    levothyroxine (SYNTHROID, LEVOTHROID) 75 MCG tablet, , Disp: , Rfl: 1   NON FORMULARY, VEMMA, Disp: , Rfl:    pantoprazole (PROTONIX) 40 MG tablet, Take 40 mg by mouth daily., Disp: , Rfl:    Probiotic Product (PROBIOTIC PO), Take by mouth., Disp: , Rfl:    traZODone (DESYREL) 50 MG tablet, Take 50 mg by mouth at bedtime., Disp: , Rfl:    venlafaxine (EFFEXOR) 100 MG tablet, Take 150  mg by mouth 2 (two) times daily. , Disp: , Rfl:   Orders Placed This Encounter  Procedures   PCV CARDIAC STRESS TEST   EKG 12-Lead   PCV ECHOCARDIOGRAM COMPLETE    There are no Patient Instructions on file for this visit.   --Continue cardiac medications as reconciled in final medication list. --Return in about 5 weeks (around 04/18/2022) for Follow up, Chest pain, Palpitations. or sooner if needed. --Continue follow-up with your primary care physician regarding the management of your other chronic comorbid conditions.  Patient's questions and concerns were addressed to her satisfaction. She voices understanding of the instructions provided during this encounter.   This note was created using a voice recognition software as a result there may be grammatical errors inadvertently enclosed that do not reflect the nature of this encounter. Every attempt is made to correct such errors.  Tessa Lerner, [place], [facility]  Pager: [phone] Office: [phone]

## 2022-03-18 ENCOUNTER — Ambulatory Visit: Payer: Medicare Other

## 2022-03-18 DIAGNOSIS — R072 Precordial pain: Secondary | ICD-10-CM

## 2022-03-24 ENCOUNTER — Ambulatory Visit
Admission: RE | Admit: 2022-03-24 | Discharge: 2022-03-24 | Disposition: A | Discharge status: Home / Self Care | Payer: Medicare Other | Source: Ambulatory Visit | Attending: Family Medicine | Admitting: Family Medicine

## 2022-03-24 DIAGNOSIS — E7800 Pure hypercholesterolemia, unspecified: Secondary | ICD-10-CM

## 2022-03-24 DIAGNOSIS — E785 Hyperlipidemia, unspecified: Secondary | ICD-10-CM | POA: Diagnosis not present

## 2022-03-25 DIAGNOSIS — H524 Presbyopia: Secondary | ICD-10-CM | POA: Diagnosis not present

## 2022-03-25 DIAGNOSIS — H04123 Dry eye syndrome of bilateral lacrimal glands: Secondary | ICD-10-CM | POA: Diagnosis not present

## 2022-04-08 ENCOUNTER — Other Ambulatory Visit (HOSPITAL_BASED_OUTPATIENT_CLINIC_OR_DEPARTMENT_OTHER): Payer: Self-pay

## 2022-04-08 MED ORDER — AMPHETAMINE-DEXTROAMPHET ER 30 MG PO CP24
30.0000 mg | ORAL_CAPSULE | Freq: Every morning | ORAL | 0 refills | Status: DC
  Filled 2022-04-08: qty 30, 30d supply, fill #0

## 2022-04-09 ENCOUNTER — Other Ambulatory Visit (HOSPITAL_BASED_OUTPATIENT_CLINIC_OR_DEPARTMENT_OTHER): Payer: Self-pay

## 2022-04-09 ENCOUNTER — Other Ambulatory Visit (HOSPITAL_COMMUNITY): Payer: Self-pay

## 2022-04-13 NOTE — Telephone Encounter (Signed)
From patient

## 2022-04-19 NOTE — Telephone Encounter (Signed)
From patient

## 2022-04-20 NOTE — Telephone Encounter (Signed)
From patient

## 2022-04-23 DIAGNOSIS — H1089 Other conjunctivitis: Secondary | ICD-10-CM | POA: Diagnosis not present

## 2022-04-23 DIAGNOSIS — J069 Acute upper respiratory infection, unspecified: Secondary | ICD-10-CM | POA: Diagnosis not present

## 2022-04-29 ENCOUNTER — Ambulatory Visit: Payer: Medicare Other | Admitting: Cardiology

## 2022-05-02 ENCOUNTER — Ambulatory Visit: Payer: Medicare Other

## 2022-05-02 DIAGNOSIS — R072 Precordial pain: Secondary | ICD-10-CM

## 2022-05-02 DIAGNOSIS — E7800 Pure hypercholesterolemia, unspecified: Secondary | ICD-10-CM | POA: Diagnosis not present

## 2022-05-02 DIAGNOSIS — Z713 Dietary counseling and surveillance: Secondary | ICD-10-CM | POA: Diagnosis not present

## 2022-05-05 ENCOUNTER — Other Ambulatory Visit (HOSPITAL_BASED_OUTPATIENT_CLINIC_OR_DEPARTMENT_OTHER): Payer: Self-pay

## 2022-05-05 MED ORDER — AMPHETAMINE-DEXTROAMPHET ER 25 MG PO CP24
25.0000 mg | ORAL_CAPSULE | Freq: Every morning | ORAL | 0 refills | Status: DC
  Filled 2022-05-09: qty 30, 30d supply, fill #0

## 2022-05-06 ENCOUNTER — Ambulatory Visit: Payer: Medicare Other | Admitting: Cardiology

## 2022-05-06 ENCOUNTER — Encounter: Payer: Self-pay | Admitting: Cardiology

## 2022-05-06 VITALS — BP 135/80 | HR 92 | Resp 16 | Ht 63.0 in | Wt 119.0 lb

## 2022-05-06 DIAGNOSIS — I10 Essential (primary) hypertension: Secondary | ICD-10-CM

## 2022-05-06 DIAGNOSIS — R072 Precordial pain: Secondary | ICD-10-CM | POA: Diagnosis not present

## 2022-05-06 DIAGNOSIS — R002 Palpitations: Secondary | ICD-10-CM

## 2022-05-06 DIAGNOSIS — E782 Mixed hyperlipidemia: Secondary | ICD-10-CM

## 2022-05-06 MED ORDER — AMLODIPINE BESYLATE 5 MG PO TABS: 5.0000 mg | ORAL_TABLET | Freq: Every morning | ORAL | 0 refills | Status: DC

## 2022-05-06 NOTE — Progress Notes (Signed)
ID:  Rutherford Guys, DOB 24-Sep-1955, MRN [id number]  PCP:  Kathyrn Lass, MD  Cardiologist:  Rex Kras, DO, [facility] (established care March 14, 2022)  Date: 05/06/22 Last Office Visit: 03/14/2022  Chief Complaint  Patient presents with   Follow-up    Reevaluation of chest pain, palpitations, discuss test results    HPI  Megan Weaver is a 67 y.o. Caucasian female whose past medical history and cardiovascular risk factors include: ADD, depression, bipolar 2, history of gastric ulcer secondary to NSAID use, renal cyst, nephrolithiasis, family history of heart disease, hypothyroidism  Patient was referred to the practice for evaluation of chest pain and palpitations.  During initial consultation her precordial chest pain appeared to be noncardiac but given her risk factors that shared decision was to proceed with echocardiogram which noted preserved LVEF, normal diastolic function, and no significant valvular heart disease.  And GXT reported to be low risk.  Clinically her precordial discomfort has essentially resolved since last visit.  In addition, her palpitations though present intermittently has not resurfaced.  We did not proceed with a Zio patch as the overall yield would be low since the symptoms were already improving by the time of the consultation.  Given her hyperlipidemia patient was reluctant to be on statin therapy.  And shared decision was to proceed with coronary calcium score which was reported to be 0.  She continues to have limited lifestyle changes.  She has kept a log of her blood pressures and home blood pressure log reviewed and SBP is consistently greater than 130 mmHg.  Dad had a myocardial infarction at the age of 15.  FUNCTIONAL STATUS: Is active on a daily basis but no structured exercise program or daily routine.  ALLERGIES: No Known Allergies  MEDICATION LIST PRIOR TO VISIT: Current Meds  Medication Sig   acetaminophen (TYLENOL) 500 MG tablet Take 500  mg by mouth every 6 (six) hours as needed.   acyclovir (ZOVIRAX) 400 MG tablet Take 400 mg by mouth 5 (five) times daily.   amLODipine (NORVASC) 5 MG tablet Take 1 tablet (5 mg total) by mouth every morning.   [START ON 05/07/2022] amphetamine-dextroamphetamine (ADDERALL XR) 25 MG 24 hr capsule Take 1 capsule by mouth in the morning.   ARIPiprazole (ABILIFY) 5 MG tablet Take 5 mg by mouth daily.   Ascorbic Acid (VITAMIN C) 1000 MG tablet Take 1,000 mg by mouth daily.   calcium citrate (CALCITRATE - DOSED IN MG ELEMENTAL CALCIUM) 950 (200 Ca) MG tablet Take 200 mg of elemental calcium by mouth daily.   Carboxymethylcellulose Sodium (EYE DROPS OP) Apply to eye.   Cholecalciferol (VITAMIN D3) 2000 units TABS Take by mouth.   Cinnamon 500 MG TABS Take by mouth.   docusate sodium (COLACE) 100 MG capsule Take 100 mg by mouth 2 (two) times daily.   gabapentin (NEURONTIN) 100 MG capsule Take 100 mg by mouth 3 (three) times daily.   L-Methylfolate-Algae (DEPLIN 15 PO) Take by mouth.   levothyroxine (SYNTHROID, LEVOTHROID) 75 MCG tablet    NON FORMULARY VEMMA   pantoprazole (PROTONIX) 40 MG tablet Take 40 mg by mouth daily.   Probiotic Product (PROBIOTIC PO) Take by mouth.   traZODone (DESYREL) 50 MG tablet Take 50 mg by mouth at bedtime.   venlafaxine (EFFEXOR) 100 MG tablet Take 150 mg by mouth 2 (two) times daily.      PAST MEDICAL HISTORY: Past Medical History:  Diagnosis Date   ADD (attention deficit disorder)  Bipolar 2 disorder ([place])    Depression    Gastric ulcer    Kidney stone    Kidney stones     PAST SURGICAL HISTORY: History reviewed. No pertinent surgical history.  FAMILY HISTORY: The patient family history includes Bradycardia in her mother; Heart attack in her father; Hypertension in her father and mother.  SOCIAL HISTORY:  The patient  reports that she has never smoked. She has never used smokeless tobacco. She reports current alcohol use. She reports that she does not  use drugs.  REVIEW OF SYSTEMS: Review of Systems  Cardiovascular:  Negative for chest pain, claudication, dyspnea on exertion, irregular heartbeat, leg swelling, near-syncope, orthopnea, palpitations, paroxysmal nocturnal dyspnea and syncope.  Respiratory:  Negative for shortness of breath.   Hematologic/Lymphatic: Negative for bleeding problem.  Musculoskeletal:  Negative for muscle cramps and myalgias.  Neurological:  Negative for dizziness and light-headedness.    PHYSICAL EXAM:    05/06/2022    2:17 PM 03/14/2022    8:58 AM 09/02/2020    6:55 PM  Vitals with BMI  Height 5\' 3"  5\' 3"  5\' 3"   Weight 119 lbs 125 lbs 130 lbs  BMI 21.09 [id number] 0000000  Systolic [id number] 0000000   Diastolic 80 76   Pulse 92 95     Physical Exam  Constitutional: No distress.  Age appropriate, hemodynamically stable.   Neck: No JVD present.  Cardiovascular: Normal rate, regular rhythm, S1 normal, S2 normal, intact distal pulses and normal pulses. Exam reveals no gallop, no S3 and no S4.  No murmur heard. Pulmonary/Chest: Effort normal and breath sounds normal. No stridor. She has no wheezes. She has no rales.  Abdominal: Soft. Bowel sounds are normal. She exhibits no distension. There is no abdominal tenderness.  Musculoskeletal:        General: No edema.     Cervical back: Neck supple.  Neurological: She is alert and oriented to person, place, and time. She has intact cranial nerves (2-12).  Skin: Skin is warm and moist.   CARDIAC DATABASE: EKG: 03/14/2022: Sinus rhythm, 90 bpm, without underlying ischemia or injury pattern.  Echocardiogram: 03/18/2022: Hyperdynamic LV systolic function with visual EF >70%. Left ventricle cavity is normal in size. Normal left ventricular wall thickness. Normal global wall motion. Normal diastolic filling pattern, normal LAP. Calculated EF 76%. No significant valvular heart disease. No prior study for comparison.    Stress Testing: Exercise treadmill stress test  05/02/2022: Exercise treadmill stress test performed using Bruce protocol.  Patient reached 9.3 METS, and 86% of age predicted maximum heart rate.  Exercise capacity was good.  No chest pain reported.  Normal heart rate and hemodynamic response. Stress EKG revealed no ischemic changes. Low risk study.  Heart Catheterization: None  Coronary artery calcium score 03/24/2022 No visible coronary artery calcifications. Total coronary calcium score of 0.   No acute or significant extracardiac abnormality.  LABORATORY DATA: External Labs: Collected: February 12, 2022 performed at [facility]. Total cholesterol 227, triglycerides 57, LDL 125, HDL 93, non-HDL 135. TSH 1.25. BUN 23, creatinine 0.89. Sodium 143, potassium 3.9, chloride 105, bicarb 30 AST 24, ALT 18, alkaline phosphatase 100. Hemoglobin 14.1, hematocrit 42.3%   IMPRESSION:    ICD-10-CM   1. Precordial pain  R07.2     2. Palpitations  R00.2     3. Benign hypertension  I10 amLODipine (NORVASC) 5 MG tablet    4. Mixed hyperlipidemia  E78.2        RECOMMENDATIONS: Soyini Spickerman is a  67 y.o. Caucasian female whose past medical history and cardiac risk factors include: ADD, depression, bipolar 2, history of gastric ulcer secondary to NSAID use, renal cyst, nephrolithiasis, family history of heart disease, hypothyroidism.  Precordial pain Since last office visit asymptomatic.Marland Kitchen Echocardiogram: Hyperdynamic LVEF, normal diastolic function, no significant valvular heart disease. GXT: Low risk. Total coronary calcium score 0  Palpitations Asymptomatic since last office visit. Will hold off additional diagnostic workup  Benign hypertension Office blood pressures are better compared to last office visit. Home blood pressures are not well-controlled. Discussed management of benign essential hypertension.   Patient is willing to start pharmacological therapy. Would avoid beta-blocker therapies if possible given her history of  depression/bipolar. Will start amlodipine 5 mg p.o. every morning. Further medication titration can also be deferred to PCP.  Mixed hyperlipidemia Most recent LDL is 125 mg/dL as of December 2023. 10-year risk of ASCVD is 6.7% -moderate intensity statin should be considered. However, coronary calcium score is 0 and therefore she would like to hold off pharmacological therapy at this time.   She will continue to implement lifestyle changes.    FINAL MEDICATION LIST END OF ENCOUNTER: Meds ordered this encounter  Medications   amLODipine (NORVASC) 5 MG tablet    Sig: Take 1 tablet (5 mg total) by mouth every morning.    Dispense:  90 tablet    Refill:  0    Medications Discontinued During This Encounter  Medication Reason   amphetamine-dextroamphetamine (ADDERALL XR) 30 MG 24 hr capsule Change in therapy     Current Outpatient Medications:    acetaminophen (TYLENOL) 500 MG tablet, Take 500 mg by mouth every 6 (six) hours as needed., Disp: , Rfl:    acyclovir (ZOVIRAX) 400 MG tablet, Take 400 mg by mouth 5 (five) times daily., Disp: , Rfl:    amLODipine (NORVASC) 5 MG tablet, Take 1 tablet (5 mg total) by mouth every morning., Disp: 90 tablet, Rfl: 0   [START ON 05/07/2022] amphetamine-dextroamphetamine (ADDERALL XR) 25 MG 24 hr capsule, Take 1 capsule by mouth in the morning., Disp: 30 capsule, Rfl: 0   ARIPiprazole (ABILIFY) 5 MG tablet, Take 5 mg by mouth daily., Disp: , Rfl:    Ascorbic Acid (VITAMIN C) 1000 MG tablet, Take 1,000 mg by mouth daily., Disp: , Rfl:    calcium citrate (CALCITRATE - DOSED IN MG ELEMENTAL CALCIUM) 950 (200 Ca) MG tablet, Take 200 mg of elemental calcium by mouth daily., Disp: , Rfl:    Carboxymethylcellulose Sodium (EYE DROPS OP), Apply to eye., Disp: , Rfl:    Cholecalciferol (VITAMIN D3) 2000 units TABS, Take by mouth., Disp: , Rfl:    Cinnamon 500 MG TABS, Take by mouth., Disp: , Rfl:    docusate sodium (COLACE) 100 MG capsule, Take 100 mg by mouth 2  (two) times daily., Disp: , Rfl:    gabapentin (NEURONTIN) 100 MG capsule, Take 100 mg by mouth 3 (three) times daily., Disp: , Rfl:    L-Methylfolate-Algae (DEPLIN 15 PO), Take by mouth., Disp: , Rfl:    levothyroxine (SYNTHROID, LEVOTHROID) 75 MCG tablet, , Disp: , Rfl: 1   NON FORMULARY, VEMMA, Disp: , Rfl:    pantoprazole (PROTONIX) 40 MG tablet, Take 40 mg by mouth daily., Disp: , Rfl:    Probiotic Product (PROBIOTIC PO), Take by mouth., Disp: , Rfl:    traZODone (DESYREL) 50 MG tablet, Take 50 mg by mouth at bedtime., Disp: , Rfl:    venlafaxine (EFFEXOR) 100 MG tablet,  Take 150 mg by mouth 2 (two) times daily. , Disp: , Rfl:   No orders of the defined types were placed in this encounter.   There are no Patient Instructions on file for this visit.   --Continue cardiac medications as reconciled in final medication list. --Return in about 1 year (around 05/06/2023) for Annual follow up visit. or sooner if needed. --Continue follow-up with your primary care physician regarding the management of your other chronic comorbid conditions.  Patient's questions and concerns were addressed to her satisfaction. She voices understanding of the instructions provided during this encounter.   This note was created using a voice recognition software as a result there may be grammatical errors inadvertently enclosed that do not reflect the nature of this encounter. Every attempt is made to correct such errors.  Rex Kras, [place], [facility]  Pager:  [phone] Office: [phone]

## 2022-05-09 ENCOUNTER — Other Ambulatory Visit (HOSPITAL_BASED_OUTPATIENT_CLINIC_OR_DEPARTMENT_OTHER): Payer: Self-pay

## 2022-05-26 DIAGNOSIS — K080 Exfoliation of teeth due to systemic causes: Secondary | ICD-10-CM | POA: Diagnosis not present

## 2022-06-08 ENCOUNTER — Other Ambulatory Visit (HOSPITAL_BASED_OUTPATIENT_CLINIC_OR_DEPARTMENT_OTHER): Payer: Self-pay

## 2022-06-08 MED ORDER — AMPHETAMINE-DEXTROAMPHET ER 25 MG PO CP24
25.0000 mg | ORAL_CAPSULE | Freq: Every morning | ORAL | 0 refills | Status: DC
  Filled 2022-06-08: qty 30, 30d supply, fill #0

## 2022-06-21 DIAGNOSIS — D235 Other benign neoplasm of skin of trunk: Secondary | ICD-10-CM | POA: Diagnosis not present

## 2022-06-21 DIAGNOSIS — Z872 Personal history of diseases of the skin and subcutaneous tissue: Secondary | ICD-10-CM | POA: Diagnosis not present

## 2022-06-21 DIAGNOSIS — Z08 Encounter for follow-up examination after completed treatment for malignant neoplasm: Secondary | ICD-10-CM | POA: Diagnosis not present

## 2022-07-11 ENCOUNTER — Other Ambulatory Visit (HOSPITAL_BASED_OUTPATIENT_CLINIC_OR_DEPARTMENT_OTHER): Payer: Self-pay

## 2022-07-11 MED ORDER — AMPHETAMINE-DEXTROAMPHET ER 25 MG PO CP24
25.0000 mg | ORAL_CAPSULE | Freq: Every morning | ORAL | 0 refills | Status: DC
  Filled 2022-07-11: qty 30, 30d supply, fill #0

## 2022-07-27 DIAGNOSIS — K080 Exfoliation of teeth due to systemic causes: Secondary | ICD-10-CM | POA: Diagnosis not present

## 2022-07-29 ENCOUNTER — Other Ambulatory Visit: Payer: Self-pay | Admitting: Cardiology

## 2022-07-29 DIAGNOSIS — I10 Essential (primary) hypertension: Secondary | ICD-10-CM

## 2022-08-10 ENCOUNTER — Other Ambulatory Visit (HOSPITAL_BASED_OUTPATIENT_CLINIC_OR_DEPARTMENT_OTHER): Payer: Self-pay

## 2022-08-10 DIAGNOSIS — E039 Hypothyroidism, unspecified: Secondary | ICD-10-CM | POA: Diagnosis not present

## 2022-08-10 DIAGNOSIS — I10 Essential (primary) hypertension: Secondary | ICD-10-CM | POA: Diagnosis not present

## 2022-08-10 DIAGNOSIS — A6004 Herpesviral vulvovaginitis: Secondary | ICD-10-CM | POA: Diagnosis not present

## 2022-08-10 DIAGNOSIS — F3181 Bipolar II disorder: Secondary | ICD-10-CM | POA: Diagnosis not present

## 2022-08-10 MED ORDER — AMPHETAMINE-DEXTROAMPHET ER 25 MG PO CP24
25.0000 mg | ORAL_CAPSULE | Freq: Every morning | ORAL | 0 refills | Status: DC
  Filled 2022-08-10: qty 30, 30d supply, fill #0

## 2022-09-08 ENCOUNTER — Other Ambulatory Visit (HOSPITAL_BASED_OUTPATIENT_CLINIC_OR_DEPARTMENT_OTHER): Payer: Self-pay

## 2022-09-08 MED ORDER — AMPHETAMINE-DEXTROAMPHET ER 25 MG PO CP24
25.0000 mg | ORAL_CAPSULE | Freq: Every morning | ORAL | 0 refills | Status: DC
  Filled 2022-09-09 (×2): qty 30, 30d supply, fill #0

## 2022-09-09 ENCOUNTER — Other Ambulatory Visit: Payer: Self-pay

## 2022-09-09 ENCOUNTER — Other Ambulatory Visit (HOSPITAL_BASED_OUTPATIENT_CLINIC_OR_DEPARTMENT_OTHER): Payer: Self-pay

## 2022-10-11 ENCOUNTER — Other Ambulatory Visit (HOSPITAL_BASED_OUTPATIENT_CLINIC_OR_DEPARTMENT_OTHER): Payer: Self-pay

## 2022-10-11 MED ORDER — AMPHETAMINE-DEXTROAMPHET ER 25 MG PO CP24
25.0000 mg | ORAL_CAPSULE | Freq: Every morning | ORAL | 0 refills | Status: DC
  Filled 2022-10-11: qty 30, 30d supply, fill #0

## 2022-11-04 ENCOUNTER — Other Ambulatory Visit: Payer: Self-pay | Admitting: Cardiology

## 2022-11-04 DIAGNOSIS — I10 Essential (primary) hypertension: Secondary | ICD-10-CM

## 2022-11-08 ENCOUNTER — Other Ambulatory Visit (HOSPITAL_BASED_OUTPATIENT_CLINIC_OR_DEPARTMENT_OTHER): Payer: Self-pay

## 2022-11-08 MED ORDER — AMPHETAMINE-DEXTROAMPHET ER 25 MG PO CP24
25.0000 mg | ORAL_CAPSULE | Freq: Every morning | ORAL | 0 refills | Status: DC
  Filled 2022-11-10: qty 30, 30d supply, fill #0

## 2022-11-10 ENCOUNTER — Other Ambulatory Visit (HOSPITAL_BASED_OUTPATIENT_CLINIC_OR_DEPARTMENT_OTHER): Payer: Self-pay

## 2022-11-10 ENCOUNTER — Other Ambulatory Visit (HOSPITAL_COMMUNITY): Payer: Self-pay

## 2022-12-01 ENCOUNTER — Other Ambulatory Visit (HOSPITAL_BASED_OUTPATIENT_CLINIC_OR_DEPARTMENT_OTHER): Payer: Self-pay

## 2022-12-01 MED ORDER — AMPHETAMINE-DEXTROAMPHET ER 25 MG PO CP24
25.0000 mg | ORAL_CAPSULE | Freq: Every morning | ORAL | 0 refills | Status: DC
  Filled 2022-12-06: qty 30, 30d supply, fill #0
  Filled ????-??-?? (×2): fill #0

## 2022-12-03 ENCOUNTER — Other Ambulatory Visit (HOSPITAL_BASED_OUTPATIENT_CLINIC_OR_DEPARTMENT_OTHER): Payer: Self-pay

## 2022-12-05 NOTE — Telephone Encounter (Signed)
 Wants Perles

## 2022-12-06 ENCOUNTER — Other Ambulatory Visit (HOSPITAL_BASED_OUTPATIENT_CLINIC_OR_DEPARTMENT_OTHER): Payer: Self-pay

## 2022-12-07 ENCOUNTER — Other Ambulatory Visit (HOSPITAL_COMMUNITY): Payer: Self-pay

## 2022-12-10 ENCOUNTER — Other Ambulatory Visit (HOSPITAL_BASED_OUTPATIENT_CLINIC_OR_DEPARTMENT_OTHER): Payer: Self-pay

## 2022-12-12 ENCOUNTER — Other Ambulatory Visit (HOSPITAL_BASED_OUTPATIENT_CLINIC_OR_DEPARTMENT_OTHER): Payer: Self-pay

## 2022-12-27 ENCOUNTER — Other Ambulatory Visit (HOSPITAL_BASED_OUTPATIENT_CLINIC_OR_DEPARTMENT_OTHER): Payer: Self-pay

## 2022-12-27 ENCOUNTER — Other Ambulatory Visit: Payer: Self-pay

## 2023-01-06 ENCOUNTER — Other Ambulatory Visit (HOSPITAL_BASED_OUTPATIENT_CLINIC_OR_DEPARTMENT_OTHER): Payer: Self-pay

## 2023-01-06 MED ORDER — AMPHETAMINE-DEXTROAMPHET ER 25 MG PO CP24
25.0000 mg | ORAL_CAPSULE | Freq: Every morning | ORAL | 0 refills | Status: DC
  Filled 2023-01-09: qty 30, 30d supply, fill #0

## 2023-01-07 ENCOUNTER — Other Ambulatory Visit (HOSPITAL_BASED_OUTPATIENT_CLINIC_OR_DEPARTMENT_OTHER): Payer: Self-pay

## 2023-01-09 ENCOUNTER — Other Ambulatory Visit (HOSPITAL_BASED_OUTPATIENT_CLINIC_OR_DEPARTMENT_OTHER): Payer: Self-pay

## 2023-01-31 DIAGNOSIS — K080 Exfoliation of teeth due to systemic causes: Secondary | ICD-10-CM | POA: Diagnosis not present

## 2023-02-02 ENCOUNTER — Other Ambulatory Visit: Payer: Self-pay | Admitting: Cardiology

## 2023-02-02 DIAGNOSIS — I10 Essential (primary) hypertension: Secondary | ICD-10-CM

## 2023-02-07 ENCOUNTER — Other Ambulatory Visit (HOSPITAL_BASED_OUTPATIENT_CLINIC_OR_DEPARTMENT_OTHER): Payer: Self-pay

## 2023-02-07 MED ORDER — AMPHETAMINE-DEXTROAMPHET ER 25 MG PO CP24
25.0000 mg | ORAL_CAPSULE | Freq: Every morning | ORAL | 0 refills | Status: DC
  Filled 2023-02-08: qty 30, 30d supply, fill #0

## 2023-02-08 ENCOUNTER — Other Ambulatory Visit (HOSPITAL_BASED_OUTPATIENT_CLINIC_OR_DEPARTMENT_OTHER): Payer: Self-pay

## 2023-02-08 DIAGNOSIS — Z0000 Encounter for general adult medical examination without abnormal findings: Secondary | ICD-10-CM | POA: Diagnosis not present

## 2023-02-08 DIAGNOSIS — E039 Hypothyroidism, unspecified: Secondary | ICD-10-CM | POA: Diagnosis not present

## 2023-02-08 DIAGNOSIS — Z136 Encounter for screening for cardiovascular disorders: Secondary | ICD-10-CM | POA: Diagnosis not present

## 2023-02-08 DIAGNOSIS — Z131 Encounter for screening for diabetes mellitus: Secondary | ICD-10-CM | POA: Diagnosis not present

## 2023-02-09 ENCOUNTER — Other Ambulatory Visit (HOSPITAL_BASED_OUTPATIENT_CLINIC_OR_DEPARTMENT_OTHER): Payer: Self-pay

## 2023-02-10 DIAGNOSIS — Z23 Encounter for immunization: Secondary | ICD-10-CM | POA: Diagnosis not present

## 2023-02-10 DIAGNOSIS — I10 Essential (primary) hypertension: Secondary | ICD-10-CM | POA: Diagnosis not present

## 2023-02-10 DIAGNOSIS — A6004 Herpesviral vulvovaginitis: Secondary | ICD-10-CM | POA: Diagnosis not present

## 2023-02-10 DIAGNOSIS — Z8711 Personal history of peptic ulcer disease: Secondary | ICD-10-CM | POA: Diagnosis not present

## 2023-03-10 ENCOUNTER — Other Ambulatory Visit (HOSPITAL_BASED_OUTPATIENT_CLINIC_OR_DEPARTMENT_OTHER): Payer: Self-pay

## 2023-03-10 DIAGNOSIS — L239 Allergic contact dermatitis, unspecified cause: Secondary | ICD-10-CM | POA: Diagnosis not present

## 2023-03-10 MED ORDER — AMPHETAMINE-DEXTROAMPHET ER 25 MG PO CP24
25.0000 mg | ORAL_CAPSULE | Freq: Every morning | ORAL | 0 refills | Status: AC
  Filled 2023-03-10 (×2): qty 30, 30d supply, fill #0

## 2023-03-12 ENCOUNTER — Other Ambulatory Visit: Payer: Self-pay

## 2023-03-14 ENCOUNTER — Other Ambulatory Visit (HOSPITAL_COMMUNITY): Payer: Self-pay

## 2023-03-21 DIAGNOSIS — Z1231 Encounter for screening mammogram for malignant neoplasm of breast: Secondary | ICD-10-CM | POA: Diagnosis not present

## 2023-04-07 ENCOUNTER — Other Ambulatory Visit (HOSPITAL_BASED_OUTPATIENT_CLINIC_OR_DEPARTMENT_OTHER): Payer: Self-pay

## 2023-04-07 MED ORDER — AMPHETAMINE-DEXTROAMPHET ER 25 MG PO CP24
25.0000 mg | ORAL_CAPSULE | Freq: Every morning | ORAL | 0 refills | Status: DC
  Filled 2023-04-08: qty 30, 30d supply, fill #0

## 2023-04-08 ENCOUNTER — Other Ambulatory Visit (HOSPITAL_BASED_OUTPATIENT_CLINIC_OR_DEPARTMENT_OTHER): Payer: Self-pay

## 2023-04-18 DIAGNOSIS — M8580 Other specified disorders of bone density and structure, unspecified site: Secondary | ICD-10-CM | POA: Diagnosis not present

## 2023-04-18 DIAGNOSIS — M8589 Other specified disorders of bone density and structure, multiple sites: Secondary | ICD-10-CM | POA: Diagnosis not present

## 2023-04-18 DIAGNOSIS — Z780 Asymptomatic menopausal state: Secondary | ICD-10-CM | POA: Diagnosis not present

## 2023-04-23 ENCOUNTER — Other Ambulatory Visit: Payer: Self-pay | Admitting: Cardiology

## 2023-04-23 DIAGNOSIS — I10 Essential (primary) hypertension: Secondary | ICD-10-CM

## 2023-05-15 ENCOUNTER — Other Ambulatory Visit (HOSPITAL_BASED_OUTPATIENT_CLINIC_OR_DEPARTMENT_OTHER): Payer: Self-pay

## 2023-05-15 MED ORDER — AMPHETAMINE-DEXTROAMPHET ER 25 MG PO CP24
25.0000 mg | ORAL_CAPSULE | Freq: Every morning | ORAL | 0 refills | Status: DC
  Filled 2023-05-15 (×3): qty 90, 90d supply, fill #0

## 2023-05-16 ENCOUNTER — Other Ambulatory Visit (HOSPITAL_BASED_OUTPATIENT_CLINIC_OR_DEPARTMENT_OTHER): Payer: Self-pay

## 2023-06-12 ENCOUNTER — Other Ambulatory Visit: Payer: Self-pay | Admitting: Cardiology

## 2023-06-12 DIAGNOSIS — I10 Essential (primary) hypertension: Secondary | ICD-10-CM

## 2023-06-15 DIAGNOSIS — R051 Acute cough: Secondary | ICD-10-CM | POA: Diagnosis not present

## 2023-06-15 DIAGNOSIS — Z03818 Encounter for observation for suspected exposure to other biological agents ruled out: Secondary | ICD-10-CM | POA: Diagnosis not present

## 2023-06-15 DIAGNOSIS — J069 Acute upper respiratory infection, unspecified: Secondary | ICD-10-CM | POA: Diagnosis not present

## 2023-06-15 DIAGNOSIS — J029 Acute pharyngitis, unspecified: Secondary | ICD-10-CM | POA: Diagnosis not present

## 2023-06-21 DIAGNOSIS — L218 Other seborrheic dermatitis: Secondary | ICD-10-CM | POA: Diagnosis not present

## 2023-06-21 DIAGNOSIS — Z08 Encounter for follow-up examination after completed treatment for malignant neoplasm: Secondary | ICD-10-CM | POA: Diagnosis not present

## 2023-06-21 DIAGNOSIS — I8390 Asymptomatic varicose veins of unspecified lower extremity: Secondary | ICD-10-CM | POA: Diagnosis not present

## 2023-06-21 DIAGNOSIS — D235 Other benign neoplasm of skin of trunk: Secondary | ICD-10-CM | POA: Diagnosis not present

## 2023-06-24 DIAGNOSIS — J019 Acute sinusitis, unspecified: Secondary | ICD-10-CM | POA: Diagnosis not present

## 2023-06-24 DIAGNOSIS — J189 Pneumonia, unspecified organism: Secondary | ICD-10-CM | POA: Diagnosis not present

## 2023-07-12 DIAGNOSIS — E559 Vitamin D deficiency, unspecified: Secondary | ICD-10-CM | POA: Diagnosis not present

## 2023-07-12 DIAGNOSIS — E039 Hypothyroidism, unspecified: Secondary | ICD-10-CM | POA: Diagnosis not present

## 2023-07-19 DIAGNOSIS — R399 Unspecified symptoms and signs involving the genitourinary system: Secondary | ICD-10-CM | POA: Diagnosis not present

## 2023-07-19 DIAGNOSIS — Z6820 Body mass index (BMI) 20.0-20.9, adult: Secondary | ICD-10-CM | POA: Diagnosis not present

## 2023-07-19 DIAGNOSIS — N898 Other specified noninflammatory disorders of vagina: Secondary | ICD-10-CM | POA: Diagnosis not present

## 2023-07-19 DIAGNOSIS — R0982 Postnasal drip: Secondary | ICD-10-CM | POA: Diagnosis not present

## 2023-07-24 DIAGNOSIS — M8580 Other specified disorders of bone density and structure, unspecified site: Secondary | ICD-10-CM | POA: Diagnosis not present

## 2023-08-08 ENCOUNTER — Other Ambulatory Visit (HOSPITAL_BASED_OUTPATIENT_CLINIC_OR_DEPARTMENT_OTHER): Payer: Self-pay

## 2023-08-08 ENCOUNTER — Encounter: Payer: Self-pay | Admitting: Internal Medicine

## 2023-08-08 ENCOUNTER — Ambulatory Visit: Admitting: Internal Medicine

## 2023-08-08 VITALS — BP 100/62 | HR 81 | Temp 98.0°F | Ht 63.0 in | Wt 113.6 lb

## 2023-08-08 DIAGNOSIS — J387 Other diseases of larynx: Secondary | ICD-10-CM | POA: Diagnosis not present

## 2023-08-08 DIAGNOSIS — K080 Exfoliation of teeth due to systemic causes: Secondary | ICD-10-CM | POA: Diagnosis not present

## 2023-08-08 DIAGNOSIS — R053 Chronic cough: Secondary | ICD-10-CM | POA: Diagnosis not present

## 2023-08-08 MED ORDER — AMPHETAMINE-DEXTROAMPHET ER 25 MG PO CP24
25.0000 mg | ORAL_CAPSULE | Freq: Every morning | ORAL | 0 refills | Status: AC
  Filled 2023-08-09: qty 90, 90d supply, fill #0

## 2023-08-08 MED ORDER — GABAPENTIN 100 MG PO CAPS: ORAL_CAPSULE | ORAL | 1 refills | Status: DC

## 2023-08-08 NOTE — Patient Instructions (Addendum)
 ICD-10-CM   1. Chronic cough  R05.3     2. Irritable larynx syndrome  J38.7       Evidence points to cough neuropathy or irritable larynx sydnrome  Plan  - Let us  make  another attempt with gabapentin as follows  - Gabapentin 100 mg once daily at night for 2 weeks and then 100 mg 2 times daily for 2 weeks and then go to 100 mg 3 times daily -> based on response and side effect profile ultimately will try to go up to 300 mg 3 times daily  - If gabapentin is a problem Lyrica would be the next option  - Refer to Theador Finer in  voice rehabilitation   - Drink a lot of water particularly when you have the urge cough and suck on sugarless lozenge  - Try to cut down on caffeinated drink  - Take 2-3 days of 100% voice rest where you are not talking or whispering  -Hold off on getting CT chest or CT sinus because you have had this at [facility] in the last 5 years - Hold off on getting allergy blood work because you have had this in the past - Please note though you do not feel GERD  - Fosfamax and lot of caffein can cause silent acied reflux that can keep cough alive  - take to PCP about alternatives to Fosfamax   FOllowup - 8 weeks video visit with DR Bertrum Brodie

## 2023-08-08 NOTE — Progress Notes (Signed)
 OV 08/08/2023  Subjective:  Patient ID: Megan Weaver, female , DOB: 1955-07-05 , age 68 y.o. , MRN: [id number] , ADDRESS: [address] [place] [address] PCP Perley Bradley, MD Patient Care Team: Perley Bradley, MD as PCP - General (Family Medicine)  This Provider for this visit: Treatment Team:  Attending Provider: Maire Scot, MD    08/08/2023 -   Chief Complaint  Patient presents with   Pulmonary Consult    Self referral. Pt c/o cough for the past 10-12 years. Her cough is typically prod with clear sputum. She states she had PNA in March 2025 after a trip to [place]. Cough tends to be worse in the am or after she eats. Talking can bring on the cough.      HPI Megan Weaver 68 y.o. --self-referred for chronic cough.  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  She suffers from chronic cough for the last 8-11 years.  She distinctly room was getting a common cold infection and after that suffering from chronic cough that never really resolved.  She says it has persisted since then.  She says she has been extensively worked up [employer] external records and confirm the same] at [employer] and [facility] in the [place] area.  She has had CT chest in 2020 that was clear.  CT sinus that was clear.  This reports that she has had allergy testing of the blood x 2 that was negative.  There is reports that she has had methacholine challenge test that was negative.  She says she has been maintained on twice daily PPI but recently primary care physician dropped it to once daily.  She does not believe she has acid reflux but she says people are treating her for that.  She is noticed to be on Fosamax.  She also admits to taking a lot of caffeinated beverages.  She does not drink any soda.  She drinks 1-1.5 alcohol drinks a day.  She also drinks coffee daily.  She does not think these things trigger her cough.  However she does admit that laughing,  talking a lot at her work because of her occupation, eating, talking on the phone all make her cough worse.  Cough associated with clearing of the throat.  The cough seldom wakes her up at night.  Most of the cough is in the daytime.     In February 2025 she went to [place] and came back and then the cough was worse.  She was treated with Flonase and Tessalon subsequently at follow-up also given Allegra.  Cough is now at baseline.  She started gabapentin 2 or 3 years ago but the dose was too strong and made her sleepy.  She is skeptical about lower dose gabapentin but she is willing to try that.  She is also had turbinates reduced by ENT.  Records reviewed but this has not helped either.  Family history - Dad had emphysema - Nephews have allergies - Dad and mom about heart disease  Medication issues - She is on Adderall for anxiety and ADD - She says she cannot tolerate prednisone - Gabapentin in the past that made her sleepy     Dr Mitchel An Reflux Symptom Index (> 13-15 suggestive of LPR cough) 0 -> 5  =  none ->severe problem 08/08/2023   Hoarseness of problem with voice 4  Clearing  Of Throat 4  Excess throat mucus or feeling of  post nasal drip 3  Difficulty swallowing food, liquid or tablets 5  Cough after eating or lying down 5  Breathing difficulties or choking episodes 2  Troublesome or annoying cough 5  Sensation of something sticking in throat or lump in throat 4  Heartburn, chest pain, indigestion, or stomach acid coming up 3  TOTAL 35      PFT      No data to display             LAB RESULTS last 96 hours No results found.       has a past medical history of ADD (attention deficit disorder), Bipolar 2 disorder (HCC), Depression, Gastric ulcer, Kidney stone, and Kidney stones.   reports that she has never smoked. She has been exposed to tobacco smoke. She has never used smokeless tobacco.  No past surgical history on file.  No Known  Allergies  Immunization History  Administered Date(s) Administered   Influenza, High Dose Seasonal PF 02/10/2023   Influenza, Seasonal, Injecte, Preservative Fre 11/25/2015   PFIZER(Purple Top)SARS-COV-2 Vaccination 04/06/2020, 04/27/2020   Pfizer(Comirnaty)Fall Seasonal Vaccine 12 years and older 11/29/2022   Tdap 09/29/2010, 09/02/2020    Family History  Problem Relation Age of Onset   Hypertension Mother    Bradycardia Mother    Hypertension Father    Heart attack Father      Current Outpatient Medications:    acyclovir (ZOVIRAX) 400 MG tablet, Take 400 mg by mouth 5 (five) times daily., Disp: , Rfl:    alendronate (FOSAMAX) 70 MG tablet, Take 70 mg by mouth once a week., Disp: , Rfl:    amLODipine  (NORVASC ) 5 MG tablet, TAKE 1 TABLET BY MOUTH IN THE MORNING, Disp: 15 tablet, Rfl: 0   amphetamine -dextroamphetamine  (ADDERALL XR) 25 MG 24 hr capsule, Take 1 capsule by mouth in the morning., Disp: 30 capsule, Rfl: 0   ARIPiprazole (ABILIFY) 5 MG tablet, Take 5 mg by mouth daily., Disp: , Rfl:    calcium citrate (CALCITRATE - DOSED IN MG ELEMENTAL CALCIUM) 950 (200 Ca) MG tablet, Take 200 mg of elemental calcium by mouth daily., Disp: , Rfl:    Carboxymethylcellulose Sodium (EYE DROPS OP), Apply to eye., Disp: , Rfl:    Cinnamon 500 MG TABS, Take by mouth., Disp: , Rfl:    docusate sodium (COLACE) 100 MG capsule, Take 100 mg by mouth 2 (two) times daily., Disp: , Rfl:    fexofenadine (ALLEGRA ALLERGY) 180 MG tablet, Take 180 mg by mouth daily., Disp: , Rfl:    gabapentin (NEURONTIN) 100 MG capsule, 1 at bedtime for 2 wks, then 1 twice daily x 2 wks, then 1 three times daily and continue this dose, Disp: 90 capsule, Rfl: 1   L-Methylfolate-Algae (DEPLIN 15 PO), Take by mouth., Disp: , Rfl:    levothyroxine (SYNTHROID, LEVOTHROID) 75 MCG tablet, , Disp: , Rfl: 1   NON FORMULARY, VEMMA, Disp: , Rfl:    pantoprazole (PROTONIX) 40 MG tablet, Take 40 mg by mouth daily., Disp: , Rfl:     Probiotic Product (PROBIOTIC PO), Take by mouth., Disp: , Rfl:    traZODone (DESYREL) 50 MG tablet, Take 50 mg by mouth at bedtime., Disp: , Rfl:    venlafaxine (EFFEXOR) 100 MG tablet, Take 150 mg by mouth 2 (two) times daily. , Disp: , Rfl:    acetaminophen  (TYLENOL ) 500 MG tablet, Take 500 mg by mouth every 6 (six) hours as needed., Disp: , Rfl:    [START ON 08/09/2023]  amphetamine -dextroamphetamine  (ADDERALL XR) 25 MG 24 hr capsule, Take 1 capsule by mouth every morning., Disp: 90 capsule, Rfl: 0      Objective:   Vitals:   08/08/23 1537  BP: 100/62  Pulse: 81  Temp: 98 F (36.7 C)  TempSrc: Oral  SpO2: 97%  Weight: 113 lb 9.6 oz (51.5 kg)  Height: 5' 3 (1.6 m)    Estimated body mass index is 20.12 kg/m as calculated from the following:   Height as of this encounter: 5' 3 (1.6 m).   Weight as of this encounter: 113 lb 9.6 oz (51.5 kg).  @WEIGHTCHANGE @  [employer]   08/08/23 1537  Weight: 113 lb 9.6 oz (51.5 kg)     Physical Exam   General: No distress. Looks well O2 at rest: no Cane present: no Sitting in wheel chair: no Frail: no Obese: no Neuro: Alert and Oriented x 3. GCS 15. Speech normal Psych: Pleasant Resp:  Barrel Chest - no.  Wheeze - no, Crackles - no, No overt respiratory distress CVS: Normal heart sounds. Murmurs - no Ext: Stigmata of Connective Tissue Disease - oo HEENT: Normal upper airway. PEERL +. No post nasal drip. CLEARS THROATS A LOT and SOME COUGH In EXAM ROOM        Assessment:       ICD-10-CM   1. Chronic cough  R05.3     2. Irritable larynx syndrome  J38.7          Plan:     Patient Instructions     ICD-10-CM   1. Chronic cough  R05.3     2. Irritable larynx syndrome  J38.7       Evidence points to cough neuropathy or irritable larynx sydnrome  Plan  - Let us  make  another attempt with gabapentin as follows  - Gabapentin 100 mg once daily at night for 2 weeks and then 100 mg 2 times daily for 2 weeks and  then go to 100 mg 3 times daily -> based on response and side effect profile ultimately will try to go up to 300 mg 3 times daily  - If gabapentin is a problem Lyrica would be the next option  - Refer to Theador Finer in  voice rehabilitation   - Drink a lot of water particularly when you have the urge cough and suck on sugarless lozenge  - Try to cut down on caffeinated drink  - Take 2-3 days of 100% voice rest where you are not talking or whispering  -Hold off on getting CT chest or CT sinus because you have had this at [facility] in the last 5 years - Hold off on getting allergy blood work because you have had this in the past - Please note though you do not feel GERD  - Fosfamax and lot of caffein can cause silent acied reflux that can keep cough alive  - take to PCP about alternatives to Fosfamax   FOllowup - 8 weeks video visit with DR Aisha Hove Return in about 8 weeks (around 10/03/2023) for with Dr Bertrum Brodie, VIDEO VISIT.    SIGNATURE    Dr. Maire Scot, M.D., F.C.C.P,  Pulmonary and Critical Care Medicine Staff Physician, [facility] Center Director - Interstitial Lung Disease  Program  Pulmonary Fibrosis [facility] [place], [place], [address]  Pager: [phone], If no answer or between  15:00h - 7:00h: call [phone] Telephone: [phone]  5:13 PM 08/08/2023

## 2023-08-09 ENCOUNTER — Other Ambulatory Visit (HOSPITAL_BASED_OUTPATIENT_CLINIC_OR_DEPARTMENT_OTHER): Payer: Self-pay

## 2023-08-09 ENCOUNTER — Other Ambulatory Visit: Payer: Self-pay | Admitting: Internal Medicine

## 2023-08-09 DIAGNOSIS — R053 Chronic cough: Secondary | ICD-10-CM

## 2023-08-09 DIAGNOSIS — J387 Other diseases of larynx: Secondary | ICD-10-CM

## 2023-08-29 DIAGNOSIS — K219 Gastro-esophageal reflux disease without esophagitis: Secondary | ICD-10-CM | POA: Diagnosis not present

## 2023-08-29 DIAGNOSIS — Z83719 Family history of colon polyps, unspecified: Secondary | ICD-10-CM | POA: Diagnosis not present

## 2023-08-30 DIAGNOSIS — K080 Exfoliation of teeth due to systemic causes: Secondary | ICD-10-CM | POA: Diagnosis not present

## 2023-09-01 DIAGNOSIS — M8580 Other specified disorders of bone density and structure, unspecified site: Secondary | ICD-10-CM | POA: Diagnosis not present

## 2023-09-14 ENCOUNTER — Encounter: Payer: Self-pay | Admitting: Speech Pathology

## 2023-09-14 ENCOUNTER — Ambulatory Visit: Attending: Internal Medicine | Admitting: Speech Pathology

## 2023-09-14 DIAGNOSIS — R490 Dysphonia: Secondary | ICD-10-CM | POA: Diagnosis not present

## 2023-09-14 DIAGNOSIS — R053 Chronic cough: Secondary | ICD-10-CM | POA: Diagnosis not present

## 2023-09-14 NOTE — Therapy (Signed)
 OUTPATIENT SPEECH LANGUAGE PATHOLOGY VOICE EVALUATION   Patient Name: Megan Weaver MRN: [id number] DOB:09/06/1955, 68 y.o., female Today's Date: 09/14/2023  REFERRING PROVIDER: Dorethia Cave, MD  END OF SESSION:  End of Session - 09/14/23 1057     Visit Number 1    Number of Visits 7    Date for SLP Re-Evaluation 12/07/23   to accomodate scheduling   SLP Start Time 0801    SLP Stop Time  0847    SLP Time Calculation (min) 46 min    Activity Tolerance Patient tolerated treatment well          Past Medical History:  Diagnosis Date   ADD (attention deficit disorder)    Bipolar 2 disorder (HCC)    Depression    Gastric ulcer    Kidney stone    Kidney stones    History reviewed. No pertinent surgical history. There are no active problems to display for this patient.   Onset date: chronic, referred 08/08/23  REFERRING DIAG:  R05.3 (ICD-10-CM) - Chronic cough  J38.7 (ICD-10-CM) - Other diseases of larynx    THERAPY DIAG:  Chronic cough  Rationale for Evaluation and Treatment: Rehabilitation  SUBJECTIVE:   SUBJECTIVE STATEMENT: This started years ago after a bad cold  Pt accompanied by: self  PERTINENT HISTORY: Per referring MD PN: Megan Weaver 68 y.o. --self-referred for chronic cough.  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  She suffers from chronic cough for the last 8-11 years.  She distinctly room was getting a common cold infection and after that suffering from chronic cough that never really resolved.  She says it has persisted since then.  She says she has been extensively worked up [employer] external records and confirm the same] at [facility] and [facility] in the [place] area.  She has had CT chest in 2020 that was clear.  CT sinus that was clear.  This reports that she has had allergy testing of the blood x 2 that was negative.  There is reports that she has had methacholine challenge test that was negative.   She says she has been maintained on twice daily PPI but recently primary care physician dropped it to once daily.  She does not believe she has acid reflux but she says people are treating her for that.  She is noticed to be on Fosamax.  She also admits to taking a lot of caffeinated beverages.  She does not drink any soda.  She drinks 1-1.5 alcohol drinks a day.  She also drinks coffee daily.  She does not think these things trigger her cough.  However she does admit that laughing, talking a lot at her work because of her occupation, eating, talking on the phone all make her cough worse.  Cough associated with clearing of the throat.  The cough seldom wakes her up at night.  Most of the cough is in the daytime.       In February 2025 she went to [place] and came back and then the cough was worse.  She was treated with Flonase and Tessalon subsequently at follow-up also given Allegra.  Cough is now at baseline.   She started gabapentin  2 or 3 years ago but the dose was too strong and made her sleepy.  She is skeptical about lower dose gabapentin  but she is willing to try that.   She is also had turbinates reduced by ENT.  Records reviewed but this has  not helped either.   Family history - Dad had emphysema - Nephews have allergies - Dad and mom about heart disease   Medication issues - She is on Adderall for anxiety and ADD - She says she cannot tolerate prednisone - Gabapentin  in the past that made her sleepy  Evidence points to cough neuropathy or irritable larynx sydnrome   PAIN:  Are you having pain? No  FALLS: Has patient fallen in last 6 months? No, Number of falls: 0  LIVING ENVIRONMENT: Lives with: lives alone Lives in: House/apartment  PLOF:Level of assistance: Independent with ADLs, Independent with IADLs Employment: Full-time employment  PATIENT GOALS: reduce cough  OBJECTIVE:  Note: Objective measures were completed at Evaluation unless otherwise  noted.  COGNITION: Overall cognitive status: Within functional limits for tasks assessed  SOCIAL HISTORY: Occupation: business Sport and exercise psychologist intake: suboptimal Caffeine/alcohol intake: minimum 2 cups of caffienated beverages; alcohol in the evening  Daily voice use: excessive  PERCEPTUAL VOICE ASSESSMENT: Voice quality: hoarse Vocal abuse: habitual throat clearing, habitual loudness, and excessive speaking Resonance: normal Respiratory function: thoracic breathing  OBJECTIVE VOICE ASSESSMENT: Maximum phonation time for sustained ah: 14 seconds  Conversational pitch average: WNL Conversational loudness average: 71 dB S/z ratio: 1.1 (Suggestive of dysfunction >1.0)  PATIENT REPORTED OUTCOME MEASURES (PROM): Dr [name] Reflux Symptom Index (> 13-15 suggestive of LPR cough) 0 -> 5  =  none ->severe problem 08/08/2023    Hoarseness of problem with voice 4  Clearing  Of Throat 4  Excess throat mucus or feeling of post nasal drip 3  Difficulty swallowing food, liquid or tablets 5  Cough after eating or lying down 5  Breathing difficulties or choking episodes 2  Troublesome or annoying cough 5  Sensation of something sticking in throat or lump in throat 4  Heartburn, chest pain, indigestion, or stomach acid coming up 3  TOTAL 35                                                                                                                             TREATMENT DATE:  Completed PROM first session  09/14/23: In patient education provided regarding nature of chronic cough factors which can increase sense of postnasal drip, globus sensation, and contribute to increased coughing. Discussed sensative natrue of throat d/t many nerve ending and further irritation by ongoing coughing throat clearing. Provided handout for reflux precautions and throat clear/cough alternatives.   PATIENT EDUCATION: Education details: see above Person educated: Patient Education method: Explanation,  Demonstration, and Handouts Education comprehension: verbalized understanding, returned demonstration, and needs further education  HOME EXERCISE PROGRAM: Reflux management, throat clear/cough alternatives  GOALS: Goals reviewed with patient? Yes  SHORT TERM GOALS: Target date: 11/09/2023  Patient will accurately demonstrate voice exercises targeting hoarseness Baseline: Goal status: INITIAL  2.  Patient will demonstrate use of cough or throat clear alternatives in 50% of opportunities over 2 therapy sessions Baseline:  Goal status: INITIAL  3.  Patient  will report carryover of reflux and vocal hygiene precautions Baseline:  Goal status: INITIAL  LONG TERM GOALS: Target date: 11/23/2023  Patient will report subjective reduction in coughing and throat clearing by 25% by discharge Baseline:  Goal status: INITIAL  2.  Patient will demonstrate clear voice in 5 minutes structured conversational Baseline:  Goal status: INITIAL  ASSESSMENT:  CLINICAL IMPRESSION: Patient is a 68 y.o. F who was seen today for ST evaluation, referred from pulmonology due to suspected irritable larynx syndrome.  Patient's symptoms are consistent with this diagnosis.  Patient presents with habitual throat clearing and coughing demonstrating more than 20 episodes over a 40-minute session.  Patient's episodes are characterized by a dry cough or throat clear and reported sense of tickle in her throat.  Patient reports symptoms are worse in the morning suggesting reflux as a contributing factor.  Patient endorses double water intake and increased caffeine and alcohol intake.  SLP initiated education for throat clearing, alternative in the relationship of voicing and coughing.  Handout provided for reflux precautions and throat clear alternatives.  I recommend skilled ST to address speech deficits.  OBJECTIVE IMPAIRMENTS: include voice disorder and chronic cough. These impairments are limiting patient from  effectively communicating at home and in community and QoL. Factors affecting potential to achieve goals and functional outcome are time since onset. Patient will benefit from skilled SLP services to address above impairments and improve overall function.  REHAB POTENTIAL: Good  PLAN:  SLP FREQUENCY: 1x/week  SLP DURATION: 10 weeks  PLANNED INTERVENTIONS: Cueing hierachy, SLP instruction and feedback, Compensatory strategies, and Patient/family education    Harlene LITTIE Ned, CCC-SLP 09/14/2023, 12:58 PM

## 2023-10-10 DIAGNOSIS — Z09 Encounter for follow-up examination after completed treatment for conditions other than malignant neoplasm: Secondary | ICD-10-CM | POA: Diagnosis not present

## 2023-10-10 DIAGNOSIS — Z860101 Personal history of adenomatous and serrated colon polyps: Secondary | ICD-10-CM | POA: Diagnosis not present

## 2023-10-10 DIAGNOSIS — D123 Benign neoplasm of transverse colon: Secondary | ICD-10-CM | POA: Diagnosis not present

## 2023-10-10 DIAGNOSIS — K5730 Diverticulosis of large intestine without perforation or abscess without bleeding: Secondary | ICD-10-CM | POA: Diagnosis not present

## 2023-10-12 DIAGNOSIS — D123 Benign neoplasm of transverse colon: Secondary | ICD-10-CM | POA: Diagnosis not present

## 2023-10-14 ENCOUNTER — Encounter: Payer: Self-pay | Admitting: Internal Medicine

## 2023-10-16 NOTE — Telephone Encounter (Signed)
 Please advise on Gabapentin  dosing and new rx

## 2023-10-17 NOTE — Telephone Encounter (Signed)
 Clnical pool  I think the pharmacy just did first month because is escalation and the 2nd month is supposed tob e steady state  So this is what I wrote for her on 08/08/23 - - Gabapentin  100 mg once daily at night for 2 weeks and then 100 mg 2 times daily for 2 weeks and then go to 100 mg 3 times daily -> based on response and side effect profile ultimately will try to go up to 300 mg 3 times daily   So my question is   Was 100mg  tid itself effective for her? When was last dose?  Based on these answers I will advise next step  Thanks  MR

## 2023-10-18 NOTE — Telephone Encounter (Signed)
**Note De-identified  [place] Obfuscation** Please advise 

## 2023-10-20 ENCOUNTER — Encounter: Admitting: Speech Pathology

## 2023-10-20 MED ORDER — GABAPENTIN 100 MG PO CAPS: 100.0000 mg | ORAL_CAPSULE | Freq: Three times a day (TID) | ORAL | 1 refills | Status: DC

## 2023-10-20 NOTE — Telephone Encounter (Signed)
 Please send a prescription for gabapentin  100 mg 3 times daily and this with the pharmacy has a full prescription for her but she needs to start the slow escalation that I did describe.  This will allow her to have excess pills in the first month but it might be more convenient.

## 2023-10-27 ENCOUNTER — Encounter: Admitting: Speech Pathology

## 2023-10-31 ENCOUNTER — Encounter: Admitting: Speech Pathology

## 2023-11-07 ENCOUNTER — Encounter: Payer: Self-pay | Admitting: Speech Pathology

## 2023-11-07 ENCOUNTER — Ambulatory Visit: Attending: Internal Medicine | Admitting: Speech Pathology

## 2023-11-07 DIAGNOSIS — R490 Dysphonia: Secondary | ICD-10-CM | POA: Diagnosis not present

## 2023-11-07 DIAGNOSIS — R053 Chronic cough: Secondary | ICD-10-CM | POA: Insufficient documentation

## 2023-11-07 NOTE — Therapy (Signed)
 OUTPATIENT SPEECH LANGUAGE PATHOLOGY VOICE TREATMENT   Patient Name: Megan Weaver MRN: [id number] DOB:October 03, 1955, 68 y.o., female Today's Date: 11/07/2023  REFERRING PROVIDER: Dorethia Cave, MD  END OF SESSION:  End of Session - 11/07/23 0854     Visit Number 2    Number of Visits 7    Date for SLP Re-Evaluation 11/23/23    SLP Start Time 0847    SLP Stop Time  0930    SLP Time Calculation (min) 43 min    Activity Tolerance Patient tolerated treatment well          Past Medical History:  Diagnosis Date   ADD (attention deficit disorder)    Bipolar 2 disorder (HCC)    Depression    Gastric ulcer    Kidney stone    Kidney stones    History reviewed. No pertinent surgical history. There are no active problems to display for this patient.   Onset date: chronic, referred 08/08/23  REFERRING DIAG:  R05.3 (ICD-10-CM) - Chronic cough  J38.7 (ICD-10-CM) - Other diseases of larynx    THERAPY DIAG:  Chronic cough  Dysphonia  Rationale for Evaluation and Treatment: Rehabilitation  SUBJECTIVE:   SUBJECTIVE STATEMENT: This started years ago after a bad cold  Pt accompanied by: self  PERTINENT HISTORY: Per referring MD PN: Megan Weaver 67 y.o. --self-referred for chronic cough.  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  She suffers from chronic cough for the last 8-11 years.  She distinctly room was getting a common cold infection and after that suffering from chronic cough that never really resolved.  She says it has persisted since then.  She says she has been extensively worked up [employer] external records and confirm the same] at [facility] and [facility] in the [place] area.  She has had CT chest in 2020 that was clear.  CT sinus that was clear.  This reports that she has had allergy testing of the blood x 2 that was negative.  There is reports that she has had methacholine challenge test that was negative.  She says she  has been maintained on twice daily PPI but recently primary care physician dropped it to once daily.  She does not believe she has acid reflux but she says people are treating her for that.  She is noticed to be on Fosamax.  She also admits to taking a lot of caffeinated beverages.  She does not drink any soda.  She drinks 1-1.5 alcohol drinks a day.  She also drinks coffee daily.  She does not think these things trigger her cough.  However she does admit that laughing, talking a lot at her work because of her occupation, eating, talking on the phone all make her cough worse.  Cough associated with clearing of the throat.  The cough seldom wakes her up at night.  Most of the cough is in the daytime.       In February 2025 she went to [place] and came back and then the cough was worse.  She was treated with Flonase and Tessalon subsequently at follow-up also given Allegra.  Cough is now at baseline.   She started gabapentin  2 or 3 years ago but the dose was too strong and made her sleepy.  She is skeptical about lower dose gabapentin  but she is willing to try that.   She is also had turbinates reduced by ENT.  Records reviewed but this has not helped  either.   Family history - Dad had emphysema - Nephews have allergies - Dad and mom about heart disease   Medication issues - She is on Adderall for anxiety and ADD - She says she cannot tolerate prednisone - Gabapentin  in the past that made her sleepy  Evidence points to cough neuropathy or irritable larynx sydnrome   PAIN:  Are you having pain? No  FALLS: Has patient fallen in last 6 months? No, Number of falls: 0  LIVING ENVIRONMENT: Lives with: lives alone Lives in: House/apartment  PLOF:Level of assistance: Independent with ADLs, Independent with IADLs Employment: Full-time employment  PATIENT GOALS: reduce cough  OBJECTIVE:  Note: Objective measures were completed at Evaluation unless otherwise noted.  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 Caffeine/alcohol intake: minimum 2 cups of caffienated beverages; alcohol in the evening  Daily voice use: excessive  PERCEPTUAL VOICE ASSESSMENT: Voice quality: hoarse Vocal abuse: habitual throat clearing, habitual loudness, and excessive speaking Resonance: normal Respiratory function: thoracic breathing  OBJECTIVE VOICE ASSESSMENT: Maximum phonation time for sustained ah: 14 seconds  Conversational pitch average: WNL Conversational loudness average: 71 dB S/z ratio: 1.1 (Suggestive of dysfunction >1.0)  PATIENT REPORTED OUTCOME MEASURES (PROM): Dr Megan Weaver Reflux Symptom Index (> 13-15 suggestive of LPR cough) 0 -> 5  =  none ->severe problem 08/08/2023    Hoarseness of problem with voice 4  Clearing  Of Throat 4  Excess throat mucus or feeling of post nasal drip 3  Difficulty swallowing food, liquid or tablets 5  Cough after eating or lying down 5  Breathing difficulties or choking episodes 2  Troublesome or annoying cough 5  Sensation of something sticking in throat or lump in throat 4  Heartburn, chest pain, indigestion, or stomach acid coming up 3  TOTAL 35                                                                                                                             TREATMENT DATE:    11/07/23: [name] has purchased voice amplifier had yet to try is with large groups. She is sleeping on incline and has increased her water intake. Initiated training on rescue breathing to stop or prevent vocal cord dysfunction. She demonstrates abdominal relaxed sniff blow with occasional min A - instruction and modeling to use rescue breathing alternating with hard swallow or sips of water. Education re: rationale and VCD education provided. Initiated training of semi-occluded vocal tract exercises (SOVTE) to be completed before work, at lunch  and after work for vocal warm up and reduction of laryngeal tension - she demonstrated this with occasional min A after initial modeling. Initiated training of breath support for speech and verbal cues for awareness of speaking on residual air.   09/14/23: In patient education provided regarding nature of chronic cough factors which can increase sense of postnasal drip, globus sensation, and contribute  to increased coughing. Discussed sensative natrue of throat d/t many nerve ending and further irritation by ongoing coughing throat clearing. Provided handout for reflux precautions and throat clear/cough alternatives.   PATIENT EDUCATION: Education details: see above Person educated: Patient Education method: Explanation, Demonstration, and Handouts Education comprehension: verbalized understanding, returned demonstration, and needs further education  HOME EXERCISE PROGRAM: Reflux management, throat clear/cough alternatives  GOALS: Goals reviewed with patient? Yes  SHORT TERM GOALS: Target date: 11/09/2023  Patient will accurately demonstrate voice exercises targeting hoarseness Baseline: Goal status: INITIAL  2.  Patient will demonstrate use of cough or throat clear alternatives in 50% of opportunities over 2 therapy sessions Baseline:  Goal status: INITIAL  3.  Patient will report carryover of reflux and vocal hygiene precautions Baseline:  Goal status: INITIAL  LONG TERM GOALS: Target date: 11/23/2023  Patient will report subjective reduction in coughing and throat clearing by 25% by discharge Baseline:  Goal status: INITIAL  2.  Patient will demonstrate clear voice in 5 minutes structured conversational Baseline:  Goal status: INITIAL  ASSESSMENT:  CLINICAL IMPRESSION: Patient is a 68 y.o. F who was seen today for ST evaluation, referred from pulmonology due to suspected irritable larynx syndrome.  Patient's symptoms are consistent with this diagnosis.  Patient presents  with habitual throat clearing and coughing demonstrating more than 20 episodes over a 40-minute session.  Patient's episodes are characterized by a dry cough or throat clear and reported sense of tickle in her throat.  Patient reports symptoms are worse in the morning suggesting reflux as a contributing factor.  Patient endorses double water intake and increased caffeine and alcohol intake.  SLP initiated education for throat clearing, alternative in the relationship of voicing and coughing.  Handout provided for reflux precautions and throat clear alternatives.  I recommend skilled ST to address speech deficits.  OBJECTIVE IMPAIRMENTS: include voice disorder and chronic cough. These impairments are limiting patient from effectively communicating at home and in community and QoL. Factors affecting potential to achieve goals and functional outcome are time since onset. Patient will benefit from skilled SLP services to address above impairments and improve overall function.  REHAB POTENTIAL: Good  PLAN:  SLP FREQUENCY: 1x/week  SLP DURATION: 10 weeks  PLANNED INTERVENTIONS: Cueing hierachy, SLP instruction and feedback, Compensatory strategies, and Patient/family education    Mathis Leita Caldron, CCC-SLP 11/07/2023, 9:37 AM

## 2023-11-07 NOTE — Patient Instructions (Signed)
  The idea is that your cough is happening for no real reason, your larynx   To stop or prevent the cough use these strategies  Sniff - blow - with relaxed face, neck, throat, and shoulders  Then hard swallow or sip  Then go back to sniff blow  Alternating some sniff-blows with some swallows or sips  You are trying to stop the vocal folds from spasm or coughing   Reflux Gourmet - 1 tsp after each meal and before bed - it's an alginate meaning it's made from algae  Thayer Lozenges - [place] herb  Traditional Medicinals Throat Coat tea (it is not delicious)  Biotene spray and gel are also options you can check out  Semi-Occluded Vocal Tract Exercises (SOVTE) Ingo Titze straw exercises on youtube  In straw hum or do hum bubbles before work, during lunch, on the way home   hum or for 5-10 seconds 10x  10 pitch glides up and down  4-5 accents or sirens 10x  [employer]

## 2023-11-10 ENCOUNTER — Other Ambulatory Visit (HOSPITAL_BASED_OUTPATIENT_CLINIC_OR_DEPARTMENT_OTHER): Payer: Self-pay

## 2023-11-10 MED ORDER — AMPHETAMINE-DEXTROAMPHET ER 25 MG PO CP24
25.0000 mg | ORAL_CAPSULE | Freq: Every morning | ORAL | 0 refills | Status: AC
  Filled 2023-11-11: qty 90, 90d supply, fill #0

## 2023-11-11 ENCOUNTER — Other Ambulatory Visit (HOSPITAL_BASED_OUTPATIENT_CLINIC_OR_DEPARTMENT_OTHER): Payer: Self-pay

## 2023-12-25 ENCOUNTER — Ambulatory Visit: Attending: Internal Medicine | Admitting: Speech Pathology

## 2023-12-25 DIAGNOSIS — R490 Dysphonia: Secondary | ICD-10-CM | POA: Diagnosis not present

## 2023-12-25 DIAGNOSIS — R053 Chronic cough: Secondary | ICD-10-CM | POA: Insufficient documentation

## 2023-12-25 NOTE — Therapy (Signed)
 OUTPATIENT SPEECH LANGUAGE PATHOLOGY VOICE TREATMENT & RECERTIFICATION   Patient Name: Megan Weaver MRN: [id number] DOB:[birth year], 68 y.o., female Today's Date: 12/25/2023  REFERRING PROVIDER: Dorethia Cave, MD  END OF SESSION:  End of Session - 12/25/23 1456     Visit Number 3    Number of Visits 7    Date for Recertification  03/18/24    SLP Start Time 1450    SLP Stop Time  1530    SLP Time Calculation (min) 40 min    Activity Tolerance Patient tolerated treatment well          Past Medical History:  Diagnosis Date   ADD (attention deficit disorder)    Bipolar 2 disorder (HCC)    Depression    Gastric ulcer    Kidney stone    Kidney stones    No past surgical history on file. There are no active problems to display for this patient.   Onset date: chronic, referred 08/08/23  REFERRING DIAG:  R05.3 (ICD-10-CM) - Chronic cough  J38.7 (ICD-10-CM) - Other diseases of larynx    THERAPY DIAG:  Chronic cough  Rationale for Evaluation and Treatment: Rehabilitation  SUBJECTIVE:   SUBJECTIVE STATEMENT: This started years ago after a bad cold  Pt accompanied by: self  PERTINENT HISTORY: Per referring MD PN: Megan Weaver 68 y.o. --self-referred for chronic cough.  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  She suffers from chronic cough for the last 8-11 years.  She distinctly room was getting a common cold infection and after that suffering from chronic cough that never really resolved.  She says it has persisted since then.  She says she has been extensively worked up the [employer] external records and confirm the same] at [facility] and [facility] in the [place] area.  She has had CT chest in 2020 that was clear.  CT sinus that was clear.  This reports that she has had allergy testing of the blood x 2 that was negative.  There is reports that she has had methacholine challenge test that was negative.  She says she has been  maintained on twice daily PPI but recently primary care physician dropped it to once daily.  She does not believe she has acid reflux but she says people are treating her for that.  She is noticed to be on Fosamax.  She also admits to taking a lot of caffeinated beverages.  She does not drink any soda.  She drinks 1-1.5 alcohol drinks a day.  She also drinks coffee daily.  She does not think these things trigger her cough.  However she does admit that laughing, talking a lot at her work because of her occupation, eating, talking on the phone all make her cough worse.  Cough associated with clearing of the throat.  The cough seldom wakes her up at night.  Most of the cough is in the daytime.       In February 2025 she went to [place] and came back and then the cough was worse.  She was treated with Flonase and Tessalon subsequently at follow-up also given Allegra.  Cough is now at baseline.   She started gabapentin  2 or 3 years ago but the dose was too strong and made her sleepy.  She is skeptical about lower dose gabapentin  but she is willing to try that.   She is also had turbinates reduced by ENT.  Records reviewed but this has not helped  either.   Family history - Dad had emphysema - Nephews have allergies - Dad and mom about heart disease   Medication issues - She is on Adderall for anxiety and ADD - She says she cannot tolerate prednisone - Gabapentin  in the past that made her sleepy  Evidence points to cough neuropathy or irritable larynx sydnrome   PAIN:  Are you having pain? No  FALLS: Has patient fallen in last 6 months? No, Number of falls: 0  LIVING ENVIRONMENT: Lives with: lives alone Lives in: House/apartment  PLOF:Level of assistance: Independent with ADLs, Independent with IADLs Employment: Full-time employment  PATIENT GOALS: reduce cough  OBJECTIVE:  Note: Objective measures were completed at Evaluation unless otherwise noted.  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 intake: suboptimal Caffeine/alcohol intake: minimum 2 cups of caffienated beverages; alcohol in the evening  Daily voice use: excessive  PERCEPTUAL VOICE ASSESSMENT: Voice quality: hoarse Vocal abuse: habitual throat clearing, habitual loudness, and excessive speaking Resonance: normal Respiratory function: thoracic breathing  OBJECTIVE VOICE ASSESSMENT: Maximum phonation time for sustained ah: 14 seconds  Conversational pitch average: WNL Conversational loudness average: 71 dB S/z ratio: 1.1 (Suggestive of dysfunction >1.0)  PATIENT REPORTED OUTCOME MEASURES (PROM): Dr [name] Reflux Symptom Index (> 13-15 suggestive of LPR cough) 0 -> 5  =  none ->severe problem 08/08/2023    Hoarseness of problem with voice 4  Clearing  Of Throat 4  Excess throat mucus or feeling of post nasal drip 3  Difficulty swallowing food, liquid or tablets 5  Cough after eating or lying down 5  Breathing difficulties or choking episodes 2  Troublesome or annoying cough 5  Sensation of something sticking in throat or lump in throat 4  Heartburn, chest pain, indigestion, or stomach acid coming up 3  TOTAL 35                                                                                                                             TREATMENT DATE:   12/25/23: Megan Weaver enters with multiple throat clears and single coughs. She required consistent to frequent verbal and visual cues (tally) to be aware of throat clearing. Reviewed throat clear suppression strategies - with ongoing verbal cues, she suppressed throat clearing 3/7 opportunities. 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 During coughing episode today, Megan Weaver required verbal cue and  modeling to successfully stop a coughing episode using sniff-blow, swallows and sips. With verbal cues she verbalized awareness that she was able to suppress the cough successfully. She is following reflux precautions and taking alginate.   11/07/23: Megan Weaver has purchased voice amplifier had yet to try is with large groups. She is sleeping on incline and has increased her water intake. Initiated training on  rescue breathing to stop or prevent vocal cord dysfunction. She demonstrates abdominal relaxed sniff blow with occasional min A - instruction and modeling to use rescue breathing alternating with hard swallow or sips of water. Education re: rationale and VCD education provided. Initiated training of semi-occluded vocal tract exercises (SOVTE) to be completed before work, at lunch and after work for vocal warm up and reduction of laryngeal tension - she demonstrated this with occasional min A after initial modeling. Initiated training of breath support for speech and verbal cues for awareness of speaking on residual air.   09/14/23: In patient education provided regarding nature of chronic cough factors which can increase sense of postnasal drip, globus sensation, and contribute to increased coughing. Discussed sensative natrue of throat d/t many nerve ending and further irritation by ongoing coughing throat clearing. Provided handout for reflux precautions and throat clear/cough alternatives.   PATIENT EDUCATION: Education details: see above Person educated: Patient Education method: Explanation, Demonstration, and Handouts Education comprehension: verbalized understanding, returned demonstration, and needs further education  HOME EXERCISE PROGRAM: Reflux management, throat clear/cough alternatives  GOALS: Goals reviewed with patient? Yes  SHORT TERM GOALS: Target date: 11/09/2023  Patient will accurately demonstrate voice exercises targeting hoarseness Baseline: Goal status: NOT MET  2.   Patient will demonstrate use of cough or throat clear alternatives in 50% of opportunities over 2 therapy sessions Baseline:  Goal status: PARTIALLY MET (1 session)  3.  Patient will report carryover of reflux and vocal hygiene precautions Baseline:  Goal status: MET  LONG TERM GOALS: Target date: 03/18/24 (recert)  Patient will report subjective reduction in coughing and throat clearing by 50% by discharge Baseline:  Goal status: UPDATED  2.  Patient will demonstrate clear voice in 5 minutes structured conversational Baseline:  Goal status: ONGOING  3. Pt will clear her throat 3 or less times per session with rare min A            Goal Status: NEW  ASSESSMENT:  CLINICAL IMPRESSION: Patient is a 68 y.o. F who was seen today for ST evaluation, referred from pulmonology due to suspected irritable larynx syndrome.  Patient's symptoms are consistent with this diagnosis.  Patient presents with habitual throat clearing and coughing demonstrating more than 20 episodes over a 40-minute session.  Patient's episodes are characterized by a dry cough or throat clear and reported sense of tickle in her throat.  Patient reports symptoms are worse in the morning suggesting reflux as a contributing factor.  Patient endorses double water intake and increased caffeine and alcohol intake.  SLP initiated education for throat clearing, alternative in the relationship of voicing and coughing.  Handout provided for reflux precautions and throat clear alternatives.  I recommend skilled ST to address speech deficits. I completed re-certification today, as pt has only been seen for 2 therapy sessions since evaluation which is not enough to meet goals. She continues to demonstrate frequent throat clearing behavior and coughing.   OBJECTIVE IMPAIRMENTS: include voice disorder and chronic cough. These impairments are limiting patient from effectively communicating at home and in community and QoL. Factors affecting  potential to achieve goals and functional outcome are time since onset. Patient will benefit from skilled SLP services to address above impairments and improve overall function.  REHAB POTENTIAL: Good  PLAN:  SLP FREQUENCY: 1x/week  SLP DURATION: 10 weeks  PLANNED INTERVENTIONS: Cueing hierachy, SLP instruction and feedback, Compensatory strategies, and Patient/family education    Mathis Leita Caldron, CCC-SLP 12/25/2023, 3:48 PM

## 2023-12-25 NOTE — Patient Instructions (Signed)
   This week - get awareness and control of throat clears and single or short coughs  We can't stop the big coughing until we stop the throat clearing and small coughs  Before the class - practice sniff-blowing in the morning - all morning and work on not throat clearing or cough  During class breaks use your breathing to rest your vocal folds  Be aware of taking breaths during your classes - don't talk when  you don't have enough air in your lungs to support your voice and speech  Be in close proximity of who you are talking to with eye contact  Mute the TV and limit having to talk over background noises  Get the person's attention before you start talking

## 2024-01-01 ENCOUNTER — Ambulatory Visit

## 2024-01-01 DIAGNOSIS — R053 Chronic cough: Secondary | ICD-10-CM | POA: Diagnosis not present

## 2024-01-01 DIAGNOSIS — R490 Dysphonia: Secondary | ICD-10-CM

## 2024-01-01 NOTE — Patient Instructions (Signed)
   Use sticky notes to remind you to not clear your throat  Sniff, swallow and sip as needed to suppress your cough and throat clear  When you feel a tickle, use your sniff, shh, swallows and sips to avoid throat clearing or coughing  Use an audible shhh when you exhale after a sniff to help control the cough  Great job using strategies to help you public speak without coughing or straining your voice

## 2024-01-01 NOTE — Therapy (Signed)
 OUTPATIENT SPEECH LANGUAGE PATHOLOGY VOICE TREATMENT & RECERTIFICATION   Patient Name: Megan Weaver MRN: [id number] DOB:[birth year], 68 y.o., female Today's Date: 01/01/2024  REFERRING PROVIDER: Dorethia Cave, MD  END OF SESSION:  End of Session - 01/01/24 1532     Visit Number 4    Number of Visits 7    Date for Recertification  03/18/24           Past Medical History:  Diagnosis Date   ADD (attention deficit disorder)    Bipolar 2 disorder (HCC)    Depression    Gastric ulcer    Kidney stone    Kidney stones    No past surgical history on file. There are no active problems to display for this patient.   Onset date: chronic, referred 08/08/23  REFERRING DIAG:  R05.3 (ICD-10-CM) - Chronic cough  J38.7 (ICD-10-CM) - Other diseases of larynx    THERAPY DIAG:  No diagnosis found.  Rationale for Evaluation and Treatment: Rehabilitation  SUBJECTIVE:   SUBJECTIVE STATEMENT: This started years ago after a bad cold  Pt accompanied by: self  PERTINENT HISTORY: Per referring MD PN: Megan Weaver 68 y.o. --self-referred for chronic cough.  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  She suffers from chronic cough for the last 8-11 years.  She distinctly room was getting a common cold infection and after that suffering from chronic cough that never really resolved.  She says it has persisted since then.  She says she has been extensively worked up the [employer] external records and confirm the same] at [facility] and [facility] in the [place] area.  She has had CT chest in 2020 that was clear.  CT sinus that was clear.  This reports that she has had allergy testing of the blood x 2 that was negative.  There is reports that she has had methacholine challenge test that was negative.  She says she has been maintained on twice daily PPI but recently primary care physician dropped it to once daily.  She does not believe she has acid reflux  but she says people are treating her for that.  She is noticed to be on Fosamax.  She also admits to taking a lot of caffeinated beverages.  She does not drink any soda.  She drinks 1-1.5 alcohol drinks a day.  She also drinks coffee daily.  She does not think these things trigger her cough.  However she does admit that laughing, talking a lot at her work because of her occupation, eating, talking on the phone all make her cough worse.  Cough associated with clearing of the throat.  The cough seldom wakes her up at night.  Most of the cough is in the daytime.       In February 2025 she went to [place] and came back and then the cough was worse.  She was treated with Flonase and Tessalon subsequently at follow-up also given Allegra.  Cough is now at baseline.   She started gabapentin  2 or 3 years ago but the dose was too strong and made her sleepy.  She is skeptical about lower dose gabapentin  but she is willing to try that.   She is also had turbinates reduced by ENT.  Records reviewed but this has not helped either.   Family history - Dad had emphysema - Nephews have allergies - Dad and mom about heart disease   Medication issues - She is on Adderall for anxiety  and ADD - She says she cannot tolerate prednisone - Gabapentin  in the past that made her sleepy  Evidence points to cough neuropathy or irritable larynx sydnrome   PAIN:  Are you having pain? No  FALLS: Has patient fallen in last 6 months? No, Number of falls: 0  LIVING ENVIRONMENT: Lives with: lives alone Lives in: House/apartment  PLOF:Level of assistance: Independent with ADLs, Independent with IADLs Employment: Full-time employment  PATIENT GOALS: reduce cough  OBJECTIVE:  Note: Objective measures were completed at Evaluation unless otherwise noted.  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:  suboptimal Caffeine/alcohol intake: minimum 2 cups of caffienated beverages; alcohol in the evening  Daily voice use: excessive  PERCEPTUAL VOICE ASSESSMENT: Voice quality: hoarse Vocal abuse: habitual throat clearing, habitual loudness, and excessive speaking Resonance: normal Respiratory function: thoracic breathing  OBJECTIVE VOICE ASSESSMENT: Maximum phonation time for sustained ah: 14 seconds  Conversational pitch average: WNL Conversational loudness average: 71 dB S/z ratio: 1.1 (Suggestive of dysfunction >1.0)  PATIENT REPORTED OUTCOME MEASURES (PROM): Dr [name] Reflux Symptom Index (> 13-15 suggestive of LPR cough) 0 -> 5  =  none ->severe problem 08/08/2023    Hoarseness of problem with voice 4  Clearing  Of Throat 4  Excess throat mucus or feeling of post nasal drip 3  Difficulty swallowing food, liquid or tablets 5  Cough after eating or lying down 5  Breathing difficulties or choking episodes 2  Troublesome or annoying cough 5  Sensation of something sticking in throat or lump in throat 4  Heartburn, chest pain, indigestion, or stomach acid coming up 3  TOTAL 35                                                                                                                             TREATMENT DATE:   01/01/24: Megan Weaver started session with a 3 throat clears. SLP reviewed sniff-blow with Megan Weaver to reduce habitual throat clearing. Megan Weaver noted that she utilized strategies (sniff-blow breathing and intentional pauses) during some recent teaching, which was successful in reducing cough and throat clearing. 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 She noted having recent thoughts that may result in major life changes; she continued to state that her chronic cough/throat clearing started around a major life event involving family. Megan Weaver utilized throat-clearing/cough strategies in 95% of  opportunities during session when given occasional min A. Overall, she throat cleared 6 times during session; a slight reduction from prior session.    12/25/23: Megan Weaver enters with multiple throat clears and single coughs. She required consistent to frequent verbal and visual cues (tally) to be aware of throat clearing. Reviewed throat clear suppression strategies - with ongoing verbal cues, she suppressed throat clearing 3/7 opportunities. 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 During coughing episode today, Megan Weaver required verbal cue and modeling to successfully stop a coughing episode using sniff-blow, swallows and sips. With verbal cues she verbalized awareness that she was able to suppress the cough successfully. She is following reflux precautions and taking alginate.   11/07/23: Megan Weaver has purchased voice amplifier had yet to try is with large groups. She is sleeping on incline and has increased her water intake. Initiated training on rescue breathing to stop or prevent vocal cord dysfunction. She demonstrates abdominal relaxed sniff blow with occasional min A - instruction and modeling to use rescue breathing alternating with hard swallow or sips of water. Education re: rationale and VCD education provided. Initiated training of semi-occluded vocal tract exercises (SOVTE) to be completed before work, at lunch and after work for vocal warm up and reduction of laryngeal tension - she demonstrated this with occasional min A after initial modeling. Initiated training of breath support for speech and verbal cues for awareness of speaking on residual air.   09/14/23: In patient education provided regarding nature of chronic cough factors which can increase sense of postnasal drip, globus sensation, and contribute to increased coughing. Discussed  sensative natrue of throat d/t many nerve ending and further irritation by ongoing coughing throat clearing. Provided handout for reflux precautions and throat clear/cough alternatives.   PATIENT EDUCATION: Education details: see above Person educated: Patient Education method: Explanation, Demonstration, and Handouts Education comprehension: verbalized understanding, returned demonstration, and needs further education  HOME EXERCISE PROGRAM: Reflux management, throat clear/cough alternatives  GOALS: Goals reviewed with patient? Yes  SHORT TERM GOALS: Target date: 11/09/2023  Patient will accurately demonstrate voice exercises targeting hoarseness Baseline: Goal status: NOT MET  2.  Patient will demonstrate use of cough or throat clear alternatives in 50% of opportunities over 2 therapy sessions Baseline:  Goal status: PARTIALLY MET (1 session)  3.  Patient will report carryover of reflux and vocal hygiene precautions Baseline:  Goal status: MET  LONG TERM GOALS: Target date: 03/18/24 (recert)  Patient will report subjective reduction in coughing and throat clearing by 50% by discharge Baseline:  Goal status: UPDATED  2.  Patient will demonstrate clear voice in 5 minutes structured conversational Baseline:  Goal status: ONGOING  3. Pt will clear her throat 3 or less times per session with rare min A            Goal Status: NEW  ASSESSMENT:  CLINICAL IMPRESSION: Patient is a 68 y.o. F who was seen today for ST evaluation, referred from pulmonology due to suspected irritable larynx syndrome.  Patient's symptoms are consistent with this diagnosis.  Patient presents with habitual throat clearing and coughing demonstrating more than 20 episodes over a 40-minute session.  Patient's episodes are characterized by a dry cough or throat clear and reported sense of tickle in her throat.  Patient reports symptoms are worse in the morning suggesting reflux as a contributing factor.   Patient endorses double water intake and increased caffeine and alcohol intake.  SLP initiated education for throat clearing, alternative in the relationship of voicing and coughing.  Handout provided for reflux precautions and throat clear alternatives.  I recommend skilled ST to address speech deficits. I completed re-certification today, as pt has only been seen for 2 therapy sessions since evaluation which is not enough to meet goals. She continues to demonstrate frequent throat clearing behavior and coughing.  OBJECTIVE IMPAIRMENTS: include voice disorder and chronic cough. These impairments are limiting patient from effectively communicating at home and in community and QoL. Factors affecting potential to achieve goals and functional outcome are time since onset. Patient will benefit from skilled SLP services to address above impairments and improve overall function.  REHAB POTENTIAL: Good  PLAN:  SLP FREQUENCY: 1x/week  SLP DURATION: 10 weeks  PLANNED INTERVENTIONS: Cueing hierachy, SLP instruction and feedback, Compensatory strategies, and Patient/family education    Mathis Leita Caldron, CCC-SLP 01/01/2024, 3:38 PM

## 2024-01-08 ENCOUNTER — Ambulatory Visit

## 2024-01-08 DIAGNOSIS — R053 Chronic cough: Secondary | ICD-10-CM | POA: Diagnosis not present

## 2024-01-08 DIAGNOSIS — R490 Dysphonia: Secondary | ICD-10-CM | POA: Diagnosis not present

## 2024-01-08 NOTE — Therapy (Signed)
 OUTPATIENT SPEECH LANGUAGE PATHOLOGY VOICE TREATMENT    Patient Name: Megan Weaver MRN: [id number] DOB:[birth year], 68 y.o., female Today's Date: 01/08/2024  REFERRING PROVIDER: Dorethia Cave, MD  END OF SESSION:  End of Session - 01/08/24 1538     Visit Number 5    Number of Visits 7    Date for Recertification  03/18/24    SLP Start Time 1448    SLP Stop Time  1530    SLP Time Calculation (min) 42 min    Activity Tolerance Patient tolerated treatment well            Past Medical History:  Diagnosis Date   ADD (attention deficit disorder)    Bipolar 2 disorder (HCC)    Depression    Gastric ulcer    Kidney stone    Kidney stones    History reviewed. No pertinent surgical history. There are no active problems to display for this patient.   Onset date: chronic, referred 08/08/23  REFERRING DIAG:  R05.3 (ICD-10-CM) - Chronic cough  J38.7 (ICD-10-CM) - Other diseases of larynx    THERAPY DIAG:  Chronic cough  Rationale for Evaluation and Treatment: Rehabilitation  SUBJECTIVE:   SUBJECTIVE STATEMENT: My cough is better when I am presenting than when I am going around the house.  Pt accompanied by: self  PERTINENT HISTORY: Per referring MD PN: Jacquese Riolo 68 y.o. --self-referred for chronic cough.  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  She suffers from chronic cough for the last 8-11 years.  She distinctly room was getting a common cold infection and after that suffering from chronic cough that never really resolved.  She says it has persisted since then.  She says she has been extensively worked up the [employer] external records and confirm the same] at [facility] and [facility] in the [place] area.  She has had CT chest in 2020 that was clear.  CT sinus that was clear.  This reports that she has had allergy testing of the blood x 2 that was negative.  There is reports that she has had methacholine challenge test  that was negative.  She says she has been maintained on twice daily PPI but recently primary care physician dropped it to once daily.  She does not believe she has acid reflux but she says people are treating her for that.  She is noticed to be on Fosamax.  She also admits to taking a lot of caffeinated beverages.  She does not drink any soda.  She drinks 1-1.5 alcohol drinks a day.  She also drinks coffee daily.  She does not think these things trigger her cough.  However she does admit that laughing, talking a lot at her work because of her occupation, eating, talking on the phone all make her cough worse.  Cough associated with clearing of the throat.  The cough seldom wakes her up at night.  Most of the cough is in the daytime.       In February 2025 she went to [place] and came back and then the cough was worse.  She was treated with Flonase and Tessalon subsequently at follow-up also given Allegra.  Cough is now at baseline.   She started gabapentin  2 or 3 years ago but the dose was too strong and made her sleepy.  She is skeptical about lower dose gabapentin  but she is willing to try that.   She is also had turbinates reduced by  ENT.  Records reviewed but this has not helped either.   Family history - Dad had emphysema - Nephews have allergies - Dad and mom about heart disease   Medication issues - She is on Adderall for anxiety and ADD - She says she cannot tolerate prednisone - Gabapentin  in the past that made her sleepy  Evidence points to cough neuropathy or irritable larynx sydnrome   PAIN:  Are you having pain? No  FALLS: Has patient fallen in last 6 months? No, Number of falls: 0  LIVING ENVIRONMENT: Lives with: lives alone Lives in: House/apartment  PLOF:Level of assistance: Independent with ADLs, Independent with IADLs Employment: Full-time employment  PATIENT GOALS: reduce cough  OBJECTIVE:  Note: Objective measures were completed at Evaluation unless otherwise  noted.  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: suboptimal Caffeine/alcohol intake: minimum 2 cups of caffienated beverages; alcohol in the evening  Daily voice use: excessive  PERCEPTUAL VOICE ASSESSMENT: Voice quality: hoarse Vocal abuse: habitual throat clearing, habitual loudness, and excessive speaking Resonance: normal Respiratory function: thoracic breathing  OBJECTIVE VOICE ASSESSMENT: Maximum phonation time for sustained ah: 14 seconds  Conversational pitch average: WNL Conversational loudness average: 71 dB S/z ratio: 1.1 (Suggestive of dysfunction >1.0)  PATIENT REPORTED OUTCOME MEASURES (PROM): Dr [name] Reflux Symptom Index (> 13-15 suggestive of LPR cough) 0 -> 5  =  none ->severe problem 08/08/2023    Hoarseness of problem with voice 4  Clearing  Of Throat 4  Excess throat mucus or feeling of post nasal drip 3  Difficulty swallowing food, liquid or tablets 5  Cough after eating or lying down 5  Breathing difficulties or choking episodes 2  Troublesome or annoying cough 5  Sensation of something sticking in throat or lump in throat 4  Heartburn, chest pain, indigestion, or stomach acid coming up 3  TOTAL 35                                                                                                                             TREATMENT DATE:   01/08/24: Pt cleared her throat 3 times during session and coughed 4 times. Pt utilized sniff-blow breathing during each instance with rare min A. Pt also used a sip of water to help minimize coughing during session. Pt noted that she used her cough strategies during another class with success. 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 Pt was curious about foods to avoid for cough/throat-clearing. SLP educated pt about potentially avoiding snacks late at night due to reflux precautions. In addition, SLP encouraged pt to increase water intake; pt is interested in increasing water intake and plans to drink 30-40 oz (previously drinking 20-30 oz)  in upcoming weeks as a goal. SLP introduced resonant voice therapy to reduced laryngeal hyperfunction for reducing chronic cough/throat clear. SLP guided pt in using tongue trills and resonant /m/ and /b/ words to increase forward placement of voice. Pt demonstrated emerging understanding of RVT; SLP will initiate HEP to maximize pt performance and carry-over.   01/01/24: Makenzee started session with a 3 throat clears. SLP reviewed sniff-blow with Tamma to reduce habitual throat clearing. Shahla noted that she utilized strategies (sniff-blow breathing and intentional pauses) during some recent teaching, which was successful in reducing cough and throat clearing. 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 She noted having recent thoughts that may result in major life changes; she continued to state that her chronic cough/throat clearing started around a major life event involving family. Unnamed utilized throat-clearing/cough strategies in 95% of opportunities during session when given occasional min A. Overall, she throat cleared 6 times during session; a slight reduction from prior session.    12/25/23: Talayla enters with multiple throat clears and single coughs. She required consistent to frequent verbal and visual cues (tally) to be aware of throat clearing. Reviewed throat clear suppression strategies - with ongoing verbal cues, she suppressed throat clearing 3/7 opportunities. 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 During coughing episode today, Viveca required verbal cue and modeling to successfully stop a coughing episode using sniff-blow, swallows and sips. With verbal cues she verbalized awareness that she was able to suppress the cough successfully. She is following reflux precautions and taking alginate.   11/07/23: Toinette has purchased voice amplifier had yet to try is with large groups. She is sleeping on incline and has increased her water intake. Initiated training on rescue breathing to stop or prevent vocal cord dysfunction. She demonstrates abdominal relaxed sniff blow with occasional min A - instruction and modeling to use rescue breathing alternating with hard swallow or sips of water. Education re: rationale and VCD education provided. Initiated training of semi-occluded vocal tract exercises (SOVTE) to be completed before work, at lunch and after work for vocal warm up and reduction of laryngeal tension - she demonstrated this with occasional min A after initial modeling. Initiated training of breath support for speech and verbal cues for awareness of speaking on residual air.   09/14/23: In patient education provided regarding nature of chronic cough factors which can increase sense of postnasal drip, globus sensation, and contribute to increased coughing. Discussed sensative natrue of throat d/t many nerve ending and further irritation by ongoing coughing throat clearing. Provided handout for reflux precautions and throat clear/cough alternatives.   PATIENT EDUCATION: Education details: see above Person educated: Patient Education method: Explanation, Demonstration, and Handouts Education comprehension: verbalized understanding, returned demonstration, and needs further education  HOME EXERCISE PROGRAM: Reflux management, throat clear/cough alternatives  GOALS: Goals reviewed with patient? Yes  SHORT TERM GOALS: Target  date: 11/09/2023  Patient will accurately demonstrate voice exercises targeting hoarseness Baseline: Goal status: NOT MET  2.  Patient will demonstrate use of cough or throat clear alternatives in 50% of opportunities over 2 therapy sessions Baseline:  Goal status: PARTIALLY MET (1 session)  3.  Patient will report carryover of reflux and vocal hygiene precautions Baseline:  Goal status: MET  LONG TERM GOALS: Target date: 03/18/24 (recert)  Patient will report subjective reduction in coughing and throat clearing by 50% by discharge Baseline:  Goal status: UPDATED  2.  Patient will demonstrate clear voice in 5 minutes structured conversational Baseline:  Goal status: ONGOING  3. Pt will clear her throat 3 or less times per session with rare min A            Goal Status: NEW  ASSESSMENT:  CLINICAL IMPRESSION: Patient is a 68 y.o. F who was seen today for ST evaluation, referred from pulmonology due to suspected irritable larynx syndrome.  Patient's symptoms are consistent with this diagnosis.  Patient presents with habitual throat clearing and coughing demonstrating more than 20 episodes over a 40-minute session.  Patient's episodes are characterized by a dry cough or throat clear and reported sense of tickle in her throat.  Patient reports symptoms are worse in the morning suggesting reflux as a contributing factor.  Patient endorses double water intake and increased caffeine and alcohol intake.  SLP initiated education for throat clearing, alternative in the relationship of voicing and coughing.  Handout provided for reflux precautions and throat clear alternatives.  I recommend skilled ST to address speech deficits. I completed re-certification today, as pt has only been seen for 2 therapy sessions since evaluation which is not enough to meet goals. She continues to demonstrate frequent throat clearing behavior and coughing.   OBJECTIVE IMPAIRMENTS: include voice disorder and chronic  cough. These impairments are limiting patient from effectively communicating at home and in community and QoL. Factors affecting potential to achieve goals and functional outcome are time since onset. Patient will benefit from skilled SLP services to address above impairments and improve overall function.  REHAB POTENTIAL: Good  PLAN:  SLP FREQUENCY: 1x/week  SLP DURATION: 10 weeks  PLANNED INTERVENTIONS: Cueing hierachy, SLP instruction and feedback, Compensatory strategies, and Patient/family education    Waddell [employer], CF-SLP 01/08/2024, 3:40 PM

## 2024-01-15 ENCOUNTER — Ambulatory Visit

## 2024-01-17 ENCOUNTER — Ambulatory Visit

## 2024-01-17 DIAGNOSIS — R490 Dysphonia: Secondary | ICD-10-CM

## 2024-01-17 DIAGNOSIS — R053 Chronic cough: Secondary | ICD-10-CM

## 2024-01-17 NOTE — Therapy (Signed)
 OUTPATIENT SPEECH LANGUAGE PATHOLOGY VOICE TREATMENT    Patient Name: Megan Weaver MRN: [id number] DOB:1955-09-02, 68 y.o., female Today's Date: 01/17/2024  REFERRING PROVIDER: Dorethia Cave, MD  END OF SESSION:  End of Session - 01/17/24 1620     Visit Number 6    Number of Visits 7    Date for Recertification  03/18/24    SLP Start Time 1100    SLP Stop Time  1147    SLP Time Calculation (min) 47 min    Activity Tolerance Patient tolerated treatment well             Past Medical History:  Diagnosis Date   ADD (attention deficit disorder)    Bipolar 2 disorder (HCC)    Depression    Gastric ulcer    Kidney stone    Kidney stones    History reviewed. No pertinent surgical history. There are no active problems to display for this patient.   Onset date: chronic, referred 08/08/23  REFERRING DIAG:  R05.3 (ICD-10-CM) - Chronic cough  J38.7 (ICD-10-CM) - Other diseases of larynx    THERAPY DIAG:  Chronic cough  Dysphonia  Rationale for Evaluation and Treatment: Rehabilitation  SUBJECTIVE:   SUBJECTIVE STATEMENT: My cough is better when I am presenting than when I am going around the house.  Pt accompanied by: self  PERTINENT HISTORY: Per referring MD PN: Megan Weaver 68 y.o. --self-referred for chronic cough.  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  She suffers from chronic cough for the last 8-11 years.  She distinctly room was getting a common cold infection and after that suffering from chronic cough that never really resolved.  She says it has persisted since then.  She says she has been extensively worked up the [employer] external records and confirm the same] at [facility] and [facility] in the [place] area.  She has had CT chest in 2020 that was clear.  CT sinus that was clear.  This reports that she has had allergy testing of the blood x 2 that was negative.  There is reports that she has had methacholine  challenge test that was negative.  She says she has been maintained on twice daily PPI but recently primary care physician dropped it to once daily.  She does not believe she has acid reflux but she says people are treating her for that.  She is noticed to be on Fosamax.  She also admits to taking a lot of caffeinated beverages.  She does not drink any soda.  She drinks 1-1.5 alcohol drinks a day.  She also drinks coffee daily.  She does not think these things trigger her cough.  However she does admit that laughing, talking a lot at her work because of her occupation, eating, talking on the phone all make her cough worse.  Cough associated with clearing of the throat.  The cough seldom wakes her up at night.  Most of the cough is in the daytime.       In February 2025 she went to [place] and came back and then the cough was worse.  She was treated with Flonase and Tessalon subsequently at follow-up also given Allegra.  Cough is now at baseline.   She started gabapentin  2 or 3 years ago but the dose was too strong and made her sleepy.  She is skeptical about lower dose gabapentin  but she is willing to try that.   She is also had  turbinates reduced by ENT.  Records reviewed but this has not helped either.   Family history - Dad had emphysema - Nephews have allergies - Dad and mom about heart disease   Medication issues - She is on Adderall for anxiety and ADD - She says she cannot tolerate prednisone - Gabapentin  in the past that made her sleepy  Evidence points to cough neuropathy or irritable larynx sydnrome   PAIN:  Are you having pain? No  FALLS: Has patient fallen in last 6 months? No, Number of falls: 0  LIVING ENVIRONMENT: Lives with: lives alone Lives in: House/apartment  PLOF:Level of assistance: Independent with ADLs, Independent with IADLs Employment: Full-time employment  PATIENT GOALS: reduce cough  OBJECTIVE:  Note: Objective measures were completed at Evaluation unless  otherwise noted.  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: suboptimal Caffeine/alcohol intake: minimum 2 cups of caffienated beverages; alcohol in the evening  Daily voice use: excessive  PERCEPTUAL VOICE ASSESSMENT: Voice quality: hoarse Vocal abuse: habitual throat clearing, habitual loudness, and excessive speaking Resonance: normal Respiratory function: thoracic breathing  OBJECTIVE VOICE ASSESSMENT: Maximum phonation time for sustained ah: 14 seconds  Conversational pitch average: WNL Conversational loudness average: 71 dB S/z ratio: 1.1 (Suggestive of dysfunction >1.0)  PATIENT REPORTED OUTCOME MEASURES (PROM): Dr [name] Reflux Symptom Index (> 13-15 suggestive of LPR cough) 0 -> 5  =  none ->severe problem 08/08/2023    Hoarseness of problem with voice 4  Clearing  Of Throat 4  Excess throat mucus or feeling of post nasal drip 3  Difficulty swallowing food, liquid or tablets 5  Cough after eating or lying down 5  Breathing difficulties or choking episodes 2  Troublesome or annoying cough 5  Sensation of something sticking in throat or lump in throat 4  Heartburn, chest pain, indigestion, or stomach acid coming up 3  TOTAL 35                                                                                                                             TREATMENT DATE:   01/17/24: Pt notes that her coughing has been terrible since previous session. Pt specified that when she is constantly busy, she forgets to utilize her cough suppression strategies. Pt notes that she thinks that her stress has been affecting her cough. Pt is planning to leave one of her jobs due to increased stress and new focus on her self-care. Pt coughed 10x during session followed by successful use of cough suppression strategies (sipping water, sniff-breath) given occasional min A. SLP reviewed  respiratory retraining to optimize diaphragmatic breathing for voice. Pt utilized diaphragmatic breathing with 85% consistency during structure voice and cough suppression exercises given rare min A. SLP continued resonant voice therapy (RVT) to decrease laryngeal hyperfunction. Pt said RVT /b, m/ words with 90% consistency when given occasional min A. SLP challenged pt to practice self-awareness  of dysphonic voice vs. Voice while performing RVT. Pt displayed emerging self-awareness and is motivated to continue practicing. When pt produced words with unbalanced vocal resonance (back of the throat), pt coughed more frequently. Pt coughing decreased during session, when pt produced voice using forward resonance and diaphragmatic breathing. Pt plans to come back in January due to life transitions and holiday break. Plan is to continue voice therapy, targeting laryngeal hyperfunction to minimize chronic cough sx and to maximize pt QOL.   01/08/24: Pt cleared her throat 3 times during session and coughed 4 times. Pt utilized sniff-blow breathing during each instance with rare min A. Pt also used a sip of water to help minimize coughing during session. Pt noted that she used her cough strategies during another class with success. 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 Pt was curious about foods to avoid for cough/throat-clearing. SLP educated pt about potentially avoiding snacks late at night due to reflux precautions. In addition, SLP encouraged pt to increase water intake; pt is interested in increasing water intake and plans to drink 30-40 oz (previously drinking 20-30 oz) in upcoming weeks as a goal. SLP introduced resonant voice therapy to reduced laryngeal hyperfunction for reducing chronic cough/throat  clear. SLP guided pt in using tongue trills and resonant /m/ and /b/ words to increase forward placement of voice. Pt demonstrated emerging understanding of RVT; SLP will initiate HEP to maximize pt performance and carry-over.   01/01/24: Ladawna started session with a 3 throat clears. SLP reviewed sniff-blow with Harvey to reduce habitual throat clearing. Josefa noted that she utilized strategies (sniff-blow breathing and intentional pauses) during some recent teaching, which was successful in reducing cough and throat clearing. 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 She noted having recent thoughts that may result in major life changes; she continued to state that her chronic cough/throat clearing started around a major life event involving family. Faizah utilized throat-clearing/cough strategies in 95% of opportunities during session when given occasional min A. Overall, she throat cleared 6 times during session; a slight reduction from prior session.    12/25/23: Tishawna enters with multiple throat clears and single coughs. She required consistent to frequent verbal and visual cues (tally) to be aware of throat clearing. Reviewed throat clear suppression strategies - with ongoing verbal cues, she suppressed throat clearing 3/7 opportunities. 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 During coughing episode today, Jaisha required verbal cue and modeling to successfully stop a coughing episode using sniff-blow, swallows and sips. With verbal cues she verbalized awareness that she was able to suppress the cough successfully. She is following reflux precautions and taking alginate.   11/07/23: Kaidan has purchased voice amplifier had yet to try is with large groups. She is  sleeping on incline and has increased her water intake. Initiated training on rescue breathing to stop or prevent vocal cord dysfunction. She demonstrates abdominal relaxed sniff blow with occasional min A - instruction and modeling to use rescue breathing alternating with hard swallow or sips of water. Education re: rationale and VCD education provided. Initiated training  of semi-occluded vocal tract exercises (SOVTE) to be completed before work, at lunch and after work for vocal warm up and reduction of laryngeal tension - she demonstrated this with occasional min A after initial modeling. Initiated training of breath support for speech and verbal cues for awareness of speaking on residual air.   09/14/23: In patient education provided regarding nature of chronic cough factors which can increase sense of postnasal drip, globus sensation, and contribute to increased coughing. Discussed sensative natrue of throat d/t many nerve ending and further irritation by ongoing coughing throat clearing. Provided handout for reflux precautions and throat clear/cough alternatives.   PATIENT EDUCATION: Education details: see above Person educated: Patient Education method: Explanation, Demonstration, and Handouts Education comprehension: verbalized understanding, returned demonstration, and needs further education  HOME EXERCISE PROGRAM: Reflux management, throat clear/cough alternatives  GOALS: Goals reviewed with patient? Yes  SHORT TERM GOALS: Target date: 11/09/2023  Patient will accurately demonstrate voice exercises targeting hoarseness Baseline: Goal status: ONGOING  2.  Patient will demonstrate use of cough or throat clear alternatives in 50% of opportunities over 2 therapy sessions Baseline:  Goal status: PARTIALLY MET (1 session)/ONGOING  3.  Patient will report carryover of reflux and vocal hygiene precautions Baseline:  Goal status: MET  LONG TERM GOALS: Target date: 03/18/24  (recert)  Patient will report subjective reduction in coughing and throat clearing by 50% by discharge Baseline:  Goal status: UPDATED/ONGOING  2.  Patient will demonstrate clear voice in 5 minutes structured conversational Baseline:  Goal status: ONGOING  3. Pt will clear her throat 3 or less times per session with rare min A            Goal Status: ONGOING  ASSESSMENT:  CLINICAL IMPRESSION: Patient is a 68 y.o. F who was seen today for ST evaluation, referred from pulmonology due to suspected irritable larynx syndrome.  Patient's symptoms are consistent with this diagnosis.  Patient presents with habitual throat clearing and coughing demonstrating more than 20 episodes over a 40-minute session.  Patient's episodes are characterized by a dry cough or throat clear and reported sense of tickle in her throat.  Patient reports symptoms are worse in the morning suggesting reflux as a contributing factor.  Patient endorses double water intake and increased caffeine and alcohol intake.  SLP initiated education for throat clearing, alternative in the relationship of voicing and coughing.  Handout provided for reflux precautions and throat clear alternatives.  I recommend skilled ST to address speech deficits. I completed re-certification today, as pt has only been seen for 2 therapy sessions since evaluation which is not enough to meet goals. She continues to demonstrate frequent throat clearing behavior and coughing.   OBJECTIVE IMPAIRMENTS: include voice disorder and chronic cough. These impairments are limiting patient from effectively communicating at home and in community and QoL. Factors affecting potential to achieve goals and functional outcome are time since onset. Patient will benefit from skilled SLP services to address above impairments and improve overall function.  REHAB POTENTIAL: Good  PLAN:  SLP FREQUENCY: 1x/week  SLP DURATION: 10 weeks  PLANNED INTERVENTIONS: Cueing hierachy,  SLP instruction and feedback, Compensatory strategies, and Patient/family education    Waddell [employer], CF-SLP 01/17/2024, 4:27 PM

## 2024-01-24 DIAGNOSIS — H2513 Age-related nuclear cataract, bilateral: Secondary | ICD-10-CM | POA: Diagnosis not present

## 2024-02-06 ENCOUNTER — Other Ambulatory Visit: Payer: Self-pay | Admitting: Internal Medicine

## 2024-02-06 DIAGNOSIS — E039 Hypothyroidism, unspecified: Secondary | ICD-10-CM | POA: Diagnosis not present

## 2024-02-06 DIAGNOSIS — M8580 Other specified disorders of bone density and structure, unspecified site: Secondary | ICD-10-CM | POA: Diagnosis not present

## 2024-02-12 ENCOUNTER — Other Ambulatory Visit (HOSPITAL_BASED_OUTPATIENT_CLINIC_OR_DEPARTMENT_OTHER): Payer: Self-pay

## 2024-02-12 ENCOUNTER — Other Ambulatory Visit: Payer: Self-pay | Admitting: Internal Medicine

## 2024-02-12 MED ORDER — AMPHETAMINE-DEXTROAMPHET ER 25 MG PO CP24
25.0000 mg | ORAL_CAPSULE | Freq: Every morning | ORAL | 0 refills | Status: AC
  Filled 2024-02-12: qty 90, 90d supply, fill #0

## 2024-02-12 NOTE — Telephone Encounter (Unsigned)
 Copied from CRM [id number]. Topic: Clinical - Medication Refill >> Feb 12, 2024  2:11 PM Whitney O wrote: Medication: gabapentin  (NEURONTIN ) 100 MG capsule  Has the patient contacted their pharmacy? Yes they are waiting to receive refill (Agent: If no, request that the patient contact the pharmacy for the refill. If patient does not wish to contact the pharmacy document the reason why and proceed with request.) (Agent: If yes, when and what did the pharmacy advise?)  This is the patient's preferred pharmacy:  [facility] [address], [place] - [address] [place] [address] Phone: [phone] Fax: [phone]  Is this the correct pharmacy for this prescription? Yes If no, delete pharmacy and type the correct one.   Has the prescription been filled recently? No last fill 10/20/2023  Is the patient out of the medication? No   Has the patient been seen for an appointment in the last year OR does the patient have an upcoming appointment? Yes  Can we respond through MyChart? Yes  Agent: Please be advised that Rx refills may take up to 3 business days. We ask that you follow-up with your pharmacy.

## 2024-02-14 ENCOUNTER — Other Ambulatory Visit (HOSPITAL_BASED_OUTPATIENT_CLINIC_OR_DEPARTMENT_OTHER): Payer: Self-pay

## 2024-02-14 MED ORDER — GABAPENTIN 100 MG PO CAPS: 100.0000 mg | ORAL_CAPSULE | Freq: Three times a day (TID) | ORAL | 1 refills | Status: AC

## 2024-02-14 NOTE — Telephone Encounter (Signed)
 Pt states pharmacy does not have any refills for rx. I called the pharmacy and they need new rx.  Rx pended with correct pharmacy I have sent rx. Nfn

## 2024-02-14 NOTE — Telephone Encounter (Signed)
 Copied from CRM #[id number]. Topic: Clinical - Prescription Issue >> Feb 13, 2024  3:12 PM Rilla B wrote: Reason for CRM: Patient calling again for the Gabapentin . Patient states she only has 1 pill left and is concerned that the pharmacy has requested the script 2 times (1 last week & resent yesterday).  It is the correct pharmacy. Please call patient @ [phone].    Dr. Geronimo is currently off.  Candis, DOD can you please advise

## 2024-02-14 NOTE — Progress Notes (Deleted)
 SABRA
